# Patient Record
Sex: Female | Born: 1965
Health system: Southern US, Community
[De-identification: ages and names within clinical notes are randomized; demographics above are authoritative.]

## PROBLEM LIST (undated history)

## (undated) DIAGNOSIS — H9209 Otalgia, unspecified ear: Secondary | ICD-10-CM

## (undated) DIAGNOSIS — I7300 Raynaud's syndrome without gangrene: Secondary | ICD-10-CM

## (undated) DIAGNOSIS — D649 Anemia, unspecified: Secondary | ICD-10-CM

## (undated) DIAGNOSIS — J111 Influenza due to unidentified influenza virus with other respiratory manifestations: Secondary | ICD-10-CM

## (undated) DIAGNOSIS — R200 Anesthesia of skin: Principal | ICD-10-CM

## (undated) DIAGNOSIS — T7589XA Other specified effects of external causes, initial encounter: Secondary | ICD-10-CM

## (undated) DIAGNOSIS — A770 Spotted fever due to Rickettsia rickettsii: Secondary | ICD-10-CM

## (undated) HISTORY — DX: Spotted fever due to Rickettsia rickettsii: A77.0

## (undated) HISTORY — DX: Influenza due to unidentified influenza virus with other respiratory manifestations: J11.1

## (undated) HISTORY — DX: Anemia, unspecified: D64.9

## (undated) HISTORY — PX: TONSILLECTOMY AND ADENOIDECTOMY: SHX28

## (undated) HISTORY — DX: Otalgia, unspecified ear: H92.09

## (undated) HISTORY — PX: OTHER SURGICAL HISTORY: SHX169

## (undated) HISTORY — DX: Anesthesia of skin: R20.0

## (undated) HISTORY — DX: Raynaud's syndrome without gangrene: I73.00

---

## 2004-04-12 ENCOUNTER — Other Ambulatory Visit: Admission: RE | Admit: 2004-04-12 | Discharge: 2004-04-12 | Payer: Self-pay | Admitting: Family Medicine

## 2005-06-17 ENCOUNTER — Other Ambulatory Visit: Admission: RE | Admit: 2005-06-17 | Discharge: 2005-06-17 | Payer: Self-pay | Admitting: Family Medicine

## 2005-09-26 ENCOUNTER — Other Ambulatory Visit: Admission: RE | Admit: 2005-09-26 | Discharge: 2005-09-26 | Payer: Self-pay | Admitting: Family Medicine

## 2006-12-08 ENCOUNTER — Other Ambulatory Visit: Admission: RE | Admit: 2006-12-08 | Discharge: 2006-12-08 | Payer: Self-pay | Admitting: Family Medicine

## 2006-12-16 ENCOUNTER — Encounter: Admission: RE | Admit: 2006-12-16 | Discharge: 2006-12-16 | Payer: Self-pay | Admitting: Family Medicine

## 2008-01-26 ENCOUNTER — Ambulatory Visit (HOSPITAL_BASED_OUTPATIENT_CLINIC_OR_DEPARTMENT_OTHER): Admission: RE | Admit: 2008-01-26 | Discharge: 2008-01-26 | Payer: Self-pay | Admitting: Urology

## 2008-02-29 ENCOUNTER — Other Ambulatory Visit: Admission: RE | Admit: 2008-02-29 | Discharge: 2008-02-29 | Payer: Self-pay | Admitting: Obstetrics & Gynecology

## 2008-10-27 ENCOUNTER — Encounter: Admission: RE | Admit: 2008-10-27 | Discharge: 2008-10-27 | Payer: Self-pay | Admitting: Family Medicine

## 2010-10-23 NOTE — Op Note (Signed)
NAME:  Ashley Mcclain, Ashley Mcclain                  ACCOUNT NO.:  [id number]   MEDICAL RECORD NO.:  [id number]         PATIENT TYPE:  HAMB   LOCATION:                               FACILITY:  [facility]   PHYSICIAN:  Martina Sinner, MD DATE OF BIRTH:  [birth year]   DATE OF PROCEDURE:  01/26/2008  DATE OF DISCHARGE:                               OPERATIVE REPORT   PREOPERATIVE DIAGNOSIS:  Pelvic pain and frequency.   POSTOPERATIVE DIAGNOSIS:  Pelvic pain and frequency and interstitial  cystitis.   OPERATION PERFORMED:  Cystoscopy with bladder hydrodistention and  bladder instillation therapy.   SURGEON:  Martina Sinner, MD   ANESTHESIA:   INDICATIONS FOR PROCEDURE:  [name] has frequency and  pelvic pressure.   DESCRIPTION OF PROCEDURE:  She was prepped and draped in the usual  fashion.  She was given preoperative antibiotics.   She was hydrodistended to approximately 650 mL.  The bag was at  appropriate height.  Initially the bladder mucosa and trigone were  normal.  There was no stitch, foreign body or carcinoma.   After I emptied the bladder, I relooked with the cystoscope and she had  diffuse glomerulations.  She had no ulcers.  She had no injury to the  bladder.  I did my usual light irrigation technique.   With the bladder nearly emptied, I instilled 15 mL of 0.5% Marcaine plus  400 mg of Pyridium.   In my opinion [name] had interstitial cystitis and it will be treated  as such.  I sent her home with Vicodin and ciprofloxacin.  I will see  her in approximately one week.           ______________________________  Martina Sinner, MD  Electronically Signed     SAM/MEDQ  D:  01/26/2008  T:  01/26/2008  Job:  [id number]

## 2010-10-27 LAB — LDL CHOLESTEROL, DIRECT: LDL Calculated: 89

## 2011-11-09 LAB — LDL CHOLESTEROL, DIRECT: LDL Calculated: 97

## 2012-06-10 HISTORY — PX: COLONOSCOPY W/ POLYPECTOMY: SHX1380

## 2012-07-23 LAB — HM MAMMOGRAPHY

## 2012-07-30 LAB — HM MAMMOGRAPHY

## 2012-08-04 ENCOUNTER — Ambulatory Visit (INDEPENDENT_AMBULATORY_CARE_PROVIDER_SITE_OTHER): Payer: 59 | Admitting: Emergency Medicine

## 2012-08-04 VITALS — BP 129/71 | HR 82 | Temp 98.8°F | Resp 16 | Ht 63.5 in | Wt 134.8 lb

## 2012-08-04 DIAGNOSIS — I7300 Raynaud's syndrome without gangrene: Secondary | ICD-10-CM

## 2012-08-04 DIAGNOSIS — Z0000 Encounter for general adult medical examination without abnormal findings: Secondary | ICD-10-CM

## 2012-08-04 DIAGNOSIS — M76899 Other specified enthesopathies of unspecified lower limb, excluding foot: Secondary | ICD-10-CM

## 2012-08-04 LAB — COMPREHENSIVE METABOLIC PANEL
Albumin: 4.5 g/dL (ref 3.5–5.2)
BUN: 17 mg/dL (ref 6–23)
CO2: 26 mEq/L (ref 19–32)
Calcium: 9.3 mg/dL (ref 8.4–10.5)
Glucose, Bld: 87 mg/dL (ref 70–99)
Potassium: 3.8 mEq/L (ref 3.5–5.3)
Sodium: 138 mEq/L (ref 135–145)
Total Protein: 7.4 g/dL (ref 6.0–8.3)

## 2012-08-04 LAB — POCT URINALYSIS DIPSTICK
Blood, UA: NEGATIVE
Glucose, UA: NEGATIVE
Leukocytes, UA: NEGATIVE
Nitrite, UA: NEGATIVE
Urobilinogen, UA: 0.2

## 2012-08-04 LAB — POCT CBC
Granulocyte percent: 68.5 %G (ref 37–80)
HCT, POC: 40.7 % (ref 37.7–47.9)
Hemoglobin: 13 g/dL (ref 12.2–16.2)
Lymph, poc: 2 (ref 0.6–3.4)
MCV: 91.4 fL (ref 80–97)
POC Granulocyte: 5.5 (ref 2–6.9)
POC LYMPH PERCENT: 24.8 %L (ref 10–50)

## 2012-08-04 LAB — POCT UA - MICROSCOPIC ONLY
Crystals, Ur, HPF, POC: NEGATIVE
RBC, urine, microscopic: NEGATIVE
WBC, Ur, HPF, POC: NEGATIVE

## 2012-08-04 LAB — MAGNESIUM: Magnesium: 1.8 mg/dL (ref 1.5–2.5)

## 2012-08-04 LAB — LIPID PANEL
Cholesterol: 159 mg/dL (ref 0–200)
HDL: 61 mg/dL (ref >39)
Triglycerides: 80 mg/dL (ref <150)

## 2012-08-04 LAB — PHOSPHORUS: Phosphorus: 4.1 mg/dL (ref 2.3–4.6)

## 2012-08-04 MED ORDER — MELOXICAM 15 MG PO TABS: 15.0000 mg | ORAL_TABLET | Freq: Every day | ORAL | Status: DC

## 2012-08-04 NOTE — Patient Instructions (Addendum)
Raynaud's Syndrome  Raynaud's Syndrome is a disorder of the blood vessels in your hands and feet. It occurs when small arteries of the arms/hands or legs/feet become sensitive to cold or emotional upset. This causes the arteries to constrict, or narrow, and reduces blood flow to the area. The color in the fingers or toes changes from white to bluish to red and this is not usually painful. There may be numbness and tingling. Sores on the skin (ulcers) can form. Symptoms are usually relieved by warming.  HOME CARE INSTRUCTIONS     Avoid exposure to cold. Keep your whole body warm and dry. Dress in layers. Wear mittens or gloves when handling ice or frozen food and when outdoors. Use holders for glasses or cans containing cold drinks. If possible, stay indoors during cold weather.   Limit your use of caffeine. Switch to decaffeinated coffee, tea, and soda pop. Avoid chocolate.   Avoid smoking or being around cigarette smoke. Smoke will make symptoms worse.   Wear loose fitting socks and comfortable, roomy shoes.   Avoid vibrating tools and machinery.   If possible, avoid stressful and emotional situations. Exercise, meditation and yoga may help you cope with stress. Biofeedback may be useful.   Ask your caregiver about medicine (calcium channel blockers) that may control Raynaud's phenomena.  SEEK MEDICAL CARE IF:     Your discomfort becomes worse, despite conservative treatment.   You develop sores on your fingers and toes that do not heal.  Document Released: 05/24/2000 Document Revised: 08/19/2011 Document Reviewed: 05/31/2008  [employer] Patient Information 2013 [employer].

## 2012-08-04 NOTE — Progress Notes (Signed)
[facility] [address], [place] [address] [phone]  Date:  08/04/2012   Name:  Ashley Mcclain   DOB:  [birth year]   MRN:  [id number]  PCP:  No primary provider on file.    Chief Complaint: Annual Exam   History of Present Illness:  Ashley Mcclain is a 47 y.o. very pleasant female patient who presents with the following:  Has had no health insurance and no health care or screening in 5 years.  Has Raynaud's symptoms when exposed to cold.   Has noticed that she has cramps in her legs when she exercises and her toes and feet cramp as well.  She has been taking a magnesium supplement but she still has symptoms.  Has some pain in her right hip that interferes with external rotation and abduction.  No other complaints or health concerns  There is no problem list on file for this patient.   Past Medical History  Diagnosis Date  . Allergy     History reviewed. No pertinent past surgical history.  History  Substance Use Topics  . Smoking status: Never Smoker   . Smokeless tobacco: Not on file  . Alcohol Use: No    Family History  Problem Relation Age of Onset  . Hypertension Mother   . Fibromyalgia Mother   . Arthritis Father   . Hypertension Brother     No Known Allergies  Medication list has been reviewed and updated.  No current outpatient prescriptions on file prior to visit.   No current facility-administered medications on file prior to visit.    Review of Systems:  As per HPI, otherwise negative.    Physical Examination: Filed Vitals:   08/04/12 1410  BP: 129/71  Pulse: 82  Temp: 98.8 F (37.1 C)  Resp: 16   Filed Vitals:   08/04/12 1410  Height: 5' 3.5" (1.613 m)  Weight: 134 lb 12.8 oz (61.145 kg)   Body mass index is 23.5 kg/(m^2). Ideal Body Weight: Weight in (lb) to have BMI = 25: 143.1  GEN: WDWN, NAD, Non-toxic, A & O x 3 HEENT: Atraumatic, Normocephalic. Neck supple. No masses, No LAD. Ears and Nose: No  external deformity. CV: RRR, No M/G/R. No JVD. No thrill. No extra heart sounds. PULM: CTA B, no wheezes, crackles, rhonchi. No retractions. No resp. distress. No accessory muscle use. ABD: S, NT, ND, +BS. No rebound. No HSM. EXTR: No c/c/e.  Hip (right) full passive ROM with some pain NEURO Normal gait.  PSYCH: Normally interactive. Conversant. Not depressed or anxious appearing.  Calm demeanor.  Pelvic - normal external genitalia, vulva, vagina, cervix, uterus and adnexa   Assessment and Plan: Raynaud's phenomenon Follow up based on labs  Carmelina Dane, MD

## 2012-08-05 NOTE — Progress Notes (Signed)
Reviewed and agree.

## 2012-08-06 LAB — PAP IG, CT-NG, RFX HPV ASCU

## 2013-03-22 ENCOUNTER — Telehealth: Payer: Self-pay

## 2013-03-22 NOTE — Telephone Encounter (Signed)
Records ready for pickup. Patient notified. °

## 2013-03-22 NOTE — Telephone Encounter (Signed)
PT STATES SHE WOULD LIKE TO COME BY AND PICK UP A COPY OF HER LAB RESULTS. SHE HAVE AN APPT TOMORROW WITH ANOTHER DR Wynelle Link CALL [phone]

## 2013-03-26 ENCOUNTER — Encounter (INDEPENDENT_AMBULATORY_CARE_PROVIDER_SITE_OTHER): Payer: Self-pay

## 2013-03-26 ENCOUNTER — Encounter: Payer: Self-pay | Admitting: Neurology

## 2013-03-26 ENCOUNTER — Ambulatory Visit (INDEPENDENT_AMBULATORY_CARE_PROVIDER_SITE_OTHER): Payer: 59 | Admitting: Neurology

## 2013-03-26 VITALS — BP 100/61 | HR 61 | Temp 96.7°F | Ht 64.0 in | Wt 136.0 lb

## 2013-03-26 DIAGNOSIS — R252 Cramp and spasm: Secondary | ICD-10-CM

## 2013-03-26 DIAGNOSIS — R202 Paresthesia of skin: Secondary | ICD-10-CM

## 2013-03-26 DIAGNOSIS — R209 Unspecified disturbances of skin sensation: Secondary | ICD-10-CM

## 2013-03-26 DIAGNOSIS — R200 Anesthesia of skin: Secondary | ICD-10-CM

## 2013-03-26 HISTORY — DX: Anesthesia of skin: R20.0

## 2013-03-26 NOTE — Progress Notes (Signed)
Subjective:    Patient ID: Ashley Mcclain is a 47 y.o. female.  HPI  [name], MD, [name] [facility] [address] [address] [place], [place] [address]  Dear Dr. Penni Bombard,  I saw your patient, Ashley Mcclain, upon your kind request in my neurologic clinic today for initial consultation of her L leg numbness, cramping and paresthesias. The patient is unaccompanied today. As you know, Ashley Mcclain is a very pleasant 47 year old right-handed woman with an underlying medical history of anemia who has been seen you for right hip pain, in the context of osteoarthritis. She had MRI testing as well as EMG and nerve conduction testing. Her EMG and nerve conduction test was reported as normal on 02/22/13, performed by Dr. Ethelene Hal and her right hip MRI from 02/10/2013 showed advanced right hip osteoarthrosis. Symptoms started about a year ago, and initially happened with activity and consisted of cramping and tingling in the calf and pressure and some tingling. Symptoms do not ascend to the upper leg, and the Sx last about 15 minutes, and even at baseline, her L distal leg does not "feel right". She has no upper body Sx and no similar Sx on the R distal leg, other than tightnee in the calf. She has R hip pain and will likely need a R THR. She has no neck pain, no radiating LBP, no injuries to head or spine. She has no FHx neurological illness, other than stroke in [facility]. She has noted no twitching, no atrophy, no calf swelling or tenderness upon touch. She has had no bug bites, no exposures to toxins or chemicals, no rash.  She had blood work on 08/04/12, which I reviewed: N CBC, N CMP, N TSH and Vit D low normal.    Her Past Medical History Is Significant For: Past Medical History  Diagnosis Date  . Allergy     To grass  . Anemia     Mild and was told she had low B12 in the past    Her Past Surgical History Is Significant For: Past Surgical History  Procedure Laterality Date  .  Tonsilectomy, adenoidectomy, bilateral myringotomy and tubes      Her Family History Is Significant For: Family History  Problem Relation Age of Onset  . Hypertension Mother   . Fibromyalgia Mother   . Arthritis Father   . Hypertension Brother     Her Social History Is Significant For: History   Social History  . Marital Status: Married    Spouse Name: N/A    Number of Children: N/A  . Years of Education: N/A   Social History Main Topics  . Smoking status: Never Smoker   . Smokeless tobacco: None  . Alcohol Use: No  . Drug Use: No  . Sexual Activity: No   Other Topics Concern  . None   Social History Narrative  . None    Her Allergies Are:  No Known Allergies:   Her Current Medications Are:  Outpatient Encounter Prescriptions as of 03/26/2013  Medication Sig Dispense Refill  . Probiotic Product (ULTRAFLORA IMMUNE HEALTH PO) Take 2 tablets by mouth daily.      Marland Kitchen XIFAXAN 550 MG TABS tablet Take 2 tablets by mouth daily.      . magnesium 30 MG tablet Take 30 mg by mouth 2 (two) times daily.      . meloxicam (MOBIC) 15 MG tablet Take 1 tablet (15 mg total) by mouth daily.  30 tablet  5  . Multiple Vitamins-Minerals (MULTIVITAMIN WITH MINERALS) tablet Take 1 tablet by mouth daily.      . vitamin B-12 (CYANOCOBALAMIN) 100 MCG tablet Take 50 mcg by mouth daily.       No facility-administered encounter medications on file as of 03/26/2013.   Review of Systems:  Out of a complete 14 point review of systems, all are reviewed and negative with the exception of these symptoms as listed below:  Review of Systems  HENT: Positive for rhinorrhea.   Musculoskeletal: Positive for arthralgias (hip).  Allergic/Immunologic: Positive for environmental allergies.  Neurological:       Restless leg    Objective:  Neurologic Exam  Physical Exam Physical Examination:   Filed Vitals:   03/26/13 0829  BP: 100/61  Pulse: 61  Temp: 96.7 F (35.9 C)    General Examination:  The patient is a very pleasant 47 y.o. female in no acute distress. She appears well-developed and well-nourished and very well groomed.   HEENT: Normocephalic, atraumatic, pupils are equal, round and reactive to light and accommodation. Funduscopic exam is normal with sharp disc margins noted. Extraocular tracking is good without limitation to gaze excursion or nystagmus noted. Normal smooth pursuit is noted. Hearing is grossly intact. Tympanic membranes are clear bilaterally. Face is symmetric with normal facial animation and normal facial sensation. Speech is clear with no dysarthria noted. There is no hypophonia. There is no lip, neck/head, jaw or voice tremor. Neck is supple with full range of passive and active motion. There are no carotid bruits on auscultation. Oropharynx exam reveals: mild mouth dryness, good dental hygiene and no significant airway crowding. Mallampati is class I. Tongue protrudes centrally and palate elevates symmetrically.   Chest: Clear to auscultation without wheezing, rhonchi or crackles noted.  Heart: S1+S2+0, regular and normal without murmurs, rubs or gallops noted.   Abdomen: Soft, non-tender and non-distended with normal bowel sounds appreciated on auscultation.  Extremities: There is no pitting edema in the distal lower extremities bilaterally. Pedal pulses are intact. She has no calf tenderness upon palpation. She has no fasciculations in her legs or arms. She has no focal or generalized atrophy. She has no redness or palpable cord in her calves.  Skin: Warm and dry without trophic changes noted. There are no varicose veins.  Musculoskeletal: exam reveals no obvious joint deformities, tenderness or joint swelling or erythema.   Neurologically:  Mental status: The patient is awake, alert and oriented in all 4 spheres. Her memory, attention, language and knowledge are appropriate. There is no aphasia, agnosia, apraxia or anomia. Speech is clear with normal  prosody and enunciation. Thought process is linear. Mood is congruent and affect is normal.  Cranial nerves are as described above under HEENT exam. In addition, shoulder shrug is normal with equal shoulder height noted. Motor exam: Normal bulk, strength and tone is noted. There is no drift, tremor or rebound. She has mild right hip pain with right hip flexion. Romberg is negative. Reflexes are 2+ throughout. Toes are downgoing bilaterally. There is no clonus.  Fine motor skills are intact with normal finger taps, normal hand movements, normal rapid alternating patting, normal foot taps and normal foot agility.  Cerebellar testing shows no dysmetria or intention tremor on finger to nose testing. Heel to shin is unremarkable bilaterally. There is no truncal or gait ataxia.  Sensory exam is intact to light touch, pinprick, vibration, temperature sense and proprioception in the upper and lower extremities. Perhaps there is mild decrease  in pinprick sensation in her left big toe and the dorsal aspect of the left foot, but it is not fully reproducible.   Gait, station and balance are unremarkable. No veering to one side is noted. No leaning to one side is noted. Posture is age-appropriate and stance is narrow based. No problems turning are noted. She turns en bloc. Tandem walk is unremarkable. Intact toe and heel stance is noted.               Assessment and Plan:    In summary, KARALYNN COTTONE is a very pleasant 47 y.o.-year old female with a one-year history of left leg problems including cramping in her calf and foot, mild paresthesias, tightness sensation and numbness. Her exam is reassuringly nonfocal. I reassured the patient in that regard. She has right hip arthritis and may need a right total hip replacement she states. She does have mild right hip pain with activation of her right hip flexors. I had a long chat with the patient about my findings and her symptoms. Thus far she has had some blood work  several months ago and a recent EMG and nerve conduction test in your office. I would like to do further testing including additional blood work and a lumbar spine MRI. I am not sure how to tie in her symptoms altogether. Nevertheless we will do more testing and will call her back with her test results. I explained to her that there is no medication that would help her symptoms at this juncture as her symptoms are rather vague and probably mostly quite mild. She was in agreement. I will see her back in about 3 months, sooner if the need arises. She is encouraged to call with any interim questions, concerns, problems or updates and test results.   Thank you very much for allowing me to participate in the care of this nice patient. If I can be of any further assistance to you please do not hesitate to call me at [phone].  Sincerely,   [name], MD, [name]

## 2013-03-26 NOTE — Patient Instructions (Addendum)
I think overall you are doing fairly well but I do want to suggest a few things today:  Remember to drink plenty of fluid, eat healthy meals and do not skip any meals. Try to eat protein with a every meal and eat a healthy snack such as fruit or nuts in between meals. Try to keep a regular sleep-wake schedule and try to exercise daily, particularly in the form of walking, 20-30 minutes a day, if you can.   As far as your medications are concerned, I would like to suggest no new medications.    As far as diagnostic testing: Blood work today, schedule you for an MRI L spine.  I would like to see you back in 3 months, sooner if we need to. Please call us with any interim questions, concerns, problems, updates or refill requests.  Please also call us for any test results so we can go over those with you on the phone. Brett Canales is my clinical assistant and will answer any of your questions and relay your messages to me and also relay most of my messages to you.  Our phone number is [phone]. We also have an after hours call service for urgent matters and there is a physician on-call for urgent questions. For any emergencies you know to call 911 or go to the nearest emergency room.

## 2013-03-30 LAB — B12 AND FOLATE PANEL: Folate: 15.2 ng/mL (ref >3.0)

## 2013-03-30 LAB — VITAMIN D 25 HYDROXY (VIT D DEFICIENCY, FRACTURES): Vit D, 25-Hydroxy: 41.5 ng/mL (ref 30.0–100.0)

## 2013-03-30 LAB — C-REACTIVE PROTEIN: CRP: 0.6 mg/L (ref 0.0–4.9)

## 2013-03-30 LAB — SEDIMENTATION RATE: Sed Rate: 4 mm/hr (ref 0–32)

## 2013-03-30 NOTE — Progress Notes (Signed)
Quick Note:  Please call and advise the patient that the recent labs we checked were within normal limits. We checked vitamin D level, vitamin B12 level, blood sugar, diabetes marker, electrolytes, kidney function, liver function, inflammatory markers, sedimentation rate, muscle enzymes. The diabetes marker was borderline, which indicates risk for diabetes.  No additional action is required on these tests at this time. Please remind patient to keep any upcoming appointments or tests and to call us with any interim questions, concerns, problems or updates. Thanks,  [name], MD, [name]    ______

## 2013-03-31 ENCOUNTER — Telehealth: Payer: Self-pay

## 2013-03-31 NOTE — Telephone Encounter (Signed)
I called and left patient a VM that her labs were normal. Her diabetes marker was borderline but still okay. Please monitor your diet, weight and be sure to exercise and follow-up with your scheduled appointments. Appointments are MRI on 04/07/2013 at 2:00 p.m. And follow-up with Dr. Frances Furbish on July 28, 2013 at noon. Please remember to arrive 15 minutes early. Please call back if you need any more information.

## 2013-03-31 NOTE — Telephone Encounter (Signed)
Message copied by [facility] on Wed Mar 31, 2013 11:42 AM ------      Message from: [name]      Created: Tue Mar 30, 2013  5:24 PM       Please call and advise the patient that the recent labs we checked were within normal limits. We checked vitamin D level, vitamin B12 level, blood sugar, diabetes marker, electrolytes, kidney function, liver function, inflammatory markers, sedimentation rate, muscle enzymes. The diabetes marker was borderline, which indicates risk for diabetes.       No additional action is required on these tests at this time. Please remind patient to keep any upcoming appointments or tests and to call us with any interim questions, concerns, problems or updates. Thanks,       [name], MD, [name]                   ------

## 2013-04-07 ENCOUNTER — Ambulatory Visit (INDEPENDENT_AMBULATORY_CARE_PROVIDER_SITE_OTHER): Payer: 59

## 2013-04-07 DIAGNOSIS — R209 Unspecified disturbances of skin sensation: Secondary | ICD-10-CM

## 2013-04-07 DIAGNOSIS — R252 Cramp and spasm: Secondary | ICD-10-CM

## 2013-04-07 DIAGNOSIS — R202 Paresthesia of skin: Secondary | ICD-10-CM

## 2013-04-07 DIAGNOSIS — R200 Anesthesia of skin: Secondary | ICD-10-CM

## 2013-04-07 MED ORDER — GADOPENTETATE DIMEGLUMINE 469.01 MG/ML IV SOLN: 12.0000 mL | Freq: Once | INTRAVENOUS | Status: AC | PRN

## 2013-04-12 NOTE — Progress Notes (Signed)
Quick Note:  Please call and advise the patient that the recent scan we did was within normal limits. We did a lumbar spine MRI without contrast which showed age-appropriate findings. No significant spinal stenosis was seen or any severe degenerative changes. She did have mild disc bulging at L4-5, which should be of no significance or consequence. No further action is required on this test at this time. Please remind patient to keep any upcoming appointments or tests and to call us with any interim questions, concerns, problems or updates. Thanks,  [name], MD, [name]   ______

## 2013-04-13 NOTE — Progress Notes (Signed)
Quick Note:  Shared normal MR lumbar spine results per Dr Teofilo Pod findings thru VM message ______

## 2013-06-18 ENCOUNTER — Ambulatory Visit (INDEPENDENT_AMBULATORY_CARE_PROVIDER_SITE_OTHER): Payer: 59 | Admitting: Emergency Medicine

## 2013-06-18 ENCOUNTER — Ambulatory Visit: Payer: 59

## 2013-06-18 VITALS — BP 118/60 | HR 85 | Temp 98.9°F | Resp 16 | Ht 64.0 in | Wt 135.8 lb

## 2013-06-18 DIAGNOSIS — R05 Cough: Secondary | ICD-10-CM

## 2013-06-18 DIAGNOSIS — R059 Cough, unspecified: Secondary | ICD-10-CM

## 2013-06-18 DIAGNOSIS — J209 Acute bronchitis, unspecified: Secondary | ICD-10-CM

## 2013-06-18 LAB — POCT INFLUENZA A/B
INFLUENZA A, POC: NEGATIVE
Influenza B, POC: NEGATIVE

## 2013-06-18 MED ORDER — AZITHROMYCIN 250 MG PO TABS: ORAL_TABLET | ORAL | Status: DC

## 2013-06-18 MED ORDER — BENZONATATE 100 MG PO CAPS: 100.0000 mg | ORAL_CAPSULE | Freq: Three times a day (TID) | ORAL | Status: DC | PRN

## 2013-06-18 NOTE — Progress Notes (Signed)
   Subjective:    Patient ID: Ashley Mcclain, female    DOB: 1965/11/15, 48 y.o.   MRN: [id number]  HPI Starting new years eve, pt started with cough. Has taken mucinex, mucinex dm, theraflu, nothing is relieving. Headache, nasal congestion, productive cough only today. Neg for wheezing, neg for smoking. Did not have flu shot this year. Has felt like she has had a fever. +chills. She also complains of being dizzy.    Review of Systems     Objective:   Physical Exam patient is alert and cooperative she is nontoxic appearing. Her neck is supple. TMs clear nose normal throat clear her chest had occasional rhonchi but no rales no dullness breath sounds are symmetrical to Results for orders placed in visit on 06/18/13  POCT INFLUENZA A/B      Result Value Range   Influenza A, POC Negative     Influenza B, POC Negative     [facility] reading (PRIMARY) by  Dr. Cleta Albertsaub mild increased markings right lower lobe        Assessment & Plan:  We'll treat with Tessalon Perles and a Z-Pak .

## 2013-07-28 ENCOUNTER — Ambulatory Visit: Payer: 59 | Admitting: Neurology

## 2013-12-13 ENCOUNTER — Ambulatory Visit: Payer: 59 | Admitting: Neurology

## 2014-01-18 ENCOUNTER — Encounter: Payer: Self-pay | Admitting: Gynecology

## 2014-01-18 ENCOUNTER — Ambulatory Visit (INDEPENDENT_AMBULATORY_CARE_PROVIDER_SITE_OTHER): Payer: 59 | Admitting: Gynecology

## 2014-01-18 VITALS — BP 114/62 | HR 68 | Resp 12 | Ht 64.0 in | Wt 134.0 lb

## 2014-01-18 DIAGNOSIS — R159 Full incontinence of feces: Secondary | ICD-10-CM

## 2014-01-18 DIAGNOSIS — R6889 Other general symptoms and signs: Secondary | ICD-10-CM

## 2014-01-18 DIAGNOSIS — G629 Polyneuropathy, unspecified: Secondary | ICD-10-CM

## 2014-01-18 DIAGNOSIS — Z0000 Encounter for general adult medical examination without abnormal findings: Secondary | ICD-10-CM

## 2014-01-18 DIAGNOSIS — IMO0002 Reserved for concepts with insufficient information to code with codable children: Secondary | ICD-10-CM

## 2014-01-18 DIAGNOSIS — G609 Hereditary and idiopathic neuropathy, unspecified: Secondary | ICD-10-CM

## 2014-01-18 DIAGNOSIS — G608 Other hereditary and idiopathic neuropathies: Secondary | ICD-10-CM

## 2014-01-18 DIAGNOSIS — Z01419 Encounter for gynecological examination (general) (routine) without abnormal findings: Secondary | ICD-10-CM

## 2014-01-18 DIAGNOSIS — N920 Excessive and frequent menstruation with regular cycle: Secondary | ICD-10-CM | POA: Insufficient documentation

## 2014-01-18 LAB — POCT URINALYSIS DIPSTICK
PH UA: 5
UROBILINOGEN UA: NEGATIVE

## 2014-01-18 LAB — HEMOGLOBIN, FINGERSTICK: HEMOGLOBIN, FINGERSTICK: 12.7 g/dL (ref 12.0–16.0)

## 2014-01-18 NOTE — Progress Notes (Signed)
48 y.o. Divorced  Caucasian female   G2P2002 here for annual exam. Pt is not currently sexually active.  Pt reports change in cycles, can change super tampon every hr with clots for 2d, increase in dysmenorrhea and rectal pain, difficulty sitting.  After heavy flow and spot on and off for several days.  Change noted over the past year.  No change in weight.  Reports loose stools and was seen by GI-had polypectomy.  Denies fibroids.  Not sexually active 4y.  Neg GC/CTM negative, TSH upper normal 3.5.  Pt has had some loss of loose stools seems diet related.  Can notice stool after voids.  Pt had 2 svd- 8#11, 8#6.  Low forcep for first.  Patient's last menstrual period was 01/11/2014.          Sexually active: No.  The current method of family planning is none.    Exercising: Yes.    cardio, weights 4-5x/wk Last pap: 08/04/12 ASCUS, neg HPV Alcohol: No Tobacco: No BSE: occassionally  Mammogram: 10/28/08 Bi-Rads 1  Hgb: 12.7 ; Urine: Trace Leuks   Health Maintenance  Topic Date Due  . Tetanus/tdap  04/20/1986  . Influenza Vaccine  01/08/2014  . Pap Smear  08/05/2015    Family History  Problem Relation Age of Onset  . Fibromyalgia Mother   . Arthritis Father   . Hypertension Brother   . Diabetes Maternal Grandfather   . Stroke Maternal Uncle   . Hypertension Father     Patient Active Problem List   Diagnosis Date Noted  . Numbness in left leg 03/26/2013    Past Medical History  Diagnosis Date  . Allergy     To grass  . Anemia     Mild and was told she had low B12 in the past  . Numbness in left leg 03/26/2013    Past Surgical History  Procedure Laterality Date  . Tonsilectomy, adenoidectomy, bilateral myringotomy and tubes    . Rectal polypectomy  2014    x3    Allergies: Review of patient's allergies indicates no known allergies.  Current Outpatient Prescriptions  Medication Sig Dispense Refill  . meloxicam (MOBIC) 15 MG tablet Take 1 tablet (15 mg total) by mouth  daily.  30 tablet  5  . Multiple Vitamins-Minerals (MULTIVITAMIN WITH MINERALS) tablet Take 1 tablet by mouth daily.      . Probiotic Product (ULTRAFLORA IMMUNE HEALTH PO) Take 2 tablets by mouth daily.      . vitamin B-12 (CYANOCOBALAMIN) 100 MCG tablet Take 50 mcg by mouth daily.      Marland Kitchen. XIFAXAN 550 MG TABS tablet Take 2 tablets by mouth daily.       No current facility-administered medications for this visit.    ROS: Pertinent items are noted in HPI.  Exam:    BP 114/62  Pulse 68  Resp 12  Ht 5\' 4"  (1.626 m)  Wt 134 lb (60.782 kg)  BMI 22.99 kg/m2  LMP 01/11/2014 Weight change: @WEIGHTCHANGE @ Last 3 height recordings:  Ht Readings from Last 3 Encounters:  01/18/14 5\' 4"  (1.626 m)  06/18/13 5\' 4"  (1.626 m)  03/26/13 5\' 4"  (1.626 m)   General appearance: alert, cooperative and appears stated age Head: Normocephalic, without obvious abnormality, atraumatic Neck: no adenopathy, no carotid bruit, no JVD, supple, symmetrical, trachea midline and thyroid not enlarged, symmetric, no tenderness/mass/nodules Lungs: clear to auscultation bilaterally Breasts: normal appearance, no masses or tenderness Heart: regular rate and rhythm, S1, S2 normal, no murmur,  click, rub or gallop Abdomen: soft, non-tender; bowel sounds normal; no masses,  no organomegaly Extremities: extremities normal, atraumatic, no cyanosis or edema Skin: Skin color, texture, turgor normal. No rashes or lesions Lymph nodes: Cervical, supraclavicular, and axillary nodes normal. no inguinal nodes palpated Neurologic: Grossly normal   Pelvic: External genitalia:  no lesions              Urethra: normal appearing urethra with no masses, tenderness or lesions              Bartholins and Skenes: Bartholin's, Urethra, Skene's normal                 Vagina: normal appearing vagina with normal color and discharge, no lesions              Cervix: normal appearance              Pap taken: No.        Bimanual Exam:  Uterus:   mobile, globular                                      Adnexa:    normal adnexa in size, nontender and no masses                                      Rectovaginal:  short perineum, confirms                                      Anus:  Weak rectal tone   1. Routine gynecological examination  counseled on breast self exam, mammography screening-overdue adequate intake of calcium and vitamin D, diet and exercise return annually or prn    2. Laboratory examination ordered as part of a routine general medical examination  - POCT Urinalysis Dipstick - Hemoglobin, fingerstick  3. Menorrhagia with regular cycle Discussed uterine changes associated with aging, recommend SHG, procedure outlined to pt.  She has had negative cultures and normal TSH  In recent past. Discussed briefly IUD or ocp to regulate menses if SHG is normal - [place] Sonohysterogram; Future  4. Peripheral sensory neuropathy Rule out uterine pathology.  Pt also with weak rectal tone, same peripheral nerve as sole of foot - [place] Sonohysterogram; Future  5. ASCUS neg HPV reviewed new pap guidelines, pap not repeat today An After Visit Summary was printed and given to the patient.

## 2014-01-24 ENCOUNTER — Telehealth: Payer: Self-pay | Admitting: Gynecology

## 2014-01-24 NOTE — Telephone Encounter (Signed)
Spoke with patient. Advised that per benefit quote received, she will be responsible for $30 copay [facility]. Patient is to call with cycle to schedule.

## 2014-02-10 NOTE — Telephone Encounter (Signed)
Spoke with patient. SHGM scheduled for 9/8 at 2pm with 3pm consult with Dr.Lathrop. Patient agreeable to date and time. Overview of SHGM given. Patient aware to call asap if she needs to cancel as it is already technically less than 72 hours with the holiday on Monday. Patient is agreeable. Advised to take  of ibuprofen/motrin one hour before appointment to reduce any cramping. Patient agreeable.  Routing to provider for final review. Patient agreeable to disposition. Will close encounter

## 2014-02-10 NOTE — Telephone Encounter (Signed)
Pt calling to schedule ultrasound. °

## 2014-02-15 ENCOUNTER — Other Ambulatory Visit: Payer: Self-pay | Admitting: Gynecology

## 2014-02-15 ENCOUNTER — Ambulatory Visit (INDEPENDENT_AMBULATORY_CARE_PROVIDER_SITE_OTHER): Payer: 59 | Admitting: Gynecology

## 2014-02-15 ENCOUNTER — Encounter: Payer: Self-pay | Admitting: Gynecology

## 2014-02-15 ENCOUNTER — Ambulatory Visit (INDEPENDENT_AMBULATORY_CARE_PROVIDER_SITE_OTHER): Payer: 59

## 2014-02-15 VITALS — BP 108/60 | Resp 12 | Ht 64.0 in | Wt 138.0 lb

## 2014-02-15 DIAGNOSIS — Z3009 Encounter for other general counseling and advice on contraception: Secondary | ICD-10-CM

## 2014-02-15 DIAGNOSIS — G629 Polyneuropathy, unspecified: Secondary | ICD-10-CM

## 2014-02-15 DIAGNOSIS — N920 Excessive and frequent menstruation with regular cycle: Secondary | ICD-10-CM

## 2014-02-15 DIAGNOSIS — G608 Other hereditary and idiopathic neuropathies: Secondary | ICD-10-CM

## 2014-02-15 DIAGNOSIS — G609 Hereditary and idiopathic neuropathy, unspecified: Secondary | ICD-10-CM

## 2014-02-15 NOTE — Patient Instructions (Signed)
Call with upcoming cycle for IUD placement Take 600-800mg  motrin before-30m

## 2014-02-15 NOTE — Progress Notes (Signed)
      Pt here for PUS for menorrhagia with regular cycle Images reviewed with pt-normal appearing uterus, no fibroids, uterus with small amount of fluid outlining endometrium, no defects noted. Ovaries are normal, no free fluid. Discussed options to treat-ocp, progestin IUD, nexplanon-modes of action, risks and benefits of all reviewed.  Pt prefers to try mirena IUD and hopefully can use until menopause. Informed regarding risks of infection and uterine perforation. Anticipated bleeding profile reviewed as well. Handout given. Questions addressed. 46m spent counseling, >50% face to face

## 2014-02-22 ENCOUNTER — Telehealth: Payer: Self-pay | Admitting: Gynecology

## 2014-02-22 NOTE — Telephone Encounter (Signed)
Left message for patient to call back. Need to go over iud benefits °

## 2014-02-23 NOTE — Telephone Encounter (Signed)
Spoke with patient. Advised that per benefits quote received, IUD and insertion is covered at 100% after $30 copay.  Patient is to call within the first 5 days of her cycle to schedule insertion.

## 2014-02-23 NOTE — Telephone Encounter (Signed)
Pt returning call

## 2014-03-14 ENCOUNTER — Telehealth: Payer: Self-pay | Admitting: Emergency Medicine

## 2014-03-14 NOTE — Telephone Encounter (Signed)
Incoming call from patient. Started cycle on 03/13/14 and would like to schedule IUD insertion with Dr. Farrel GobbleLathrop. Scheduled for 03/16/14 with Dr. Farrel GobbleLathrop  Pre procedure instructions given.  Motrin instructions given. Motrin=Advil=Ibuprofen, 800 mg one hour before appointment. Eat a meal and hydrate well before appointment.  Patient agreeable.   Routing to provider for final review. Patient agreeable to disposition. Will close encounter

## 2014-03-16 ENCOUNTER — Encounter: Payer: Self-pay | Admitting: Gynecology

## 2014-03-16 ENCOUNTER — Ambulatory Visit (INDEPENDENT_AMBULATORY_CARE_PROVIDER_SITE_OTHER): Payer: 59 | Admitting: Gynecology

## 2014-03-16 VITALS — BP 108/62 | HR 60 | Resp 16 | Ht 64.0 in | Wt 138.0 lb

## 2014-03-16 DIAGNOSIS — Z30430 Encounter for insertion of intrauterine contraceptive device: Secondary | ICD-10-CM

## 2014-03-16 NOTE — Progress Notes (Signed)
47 yrs DivorcedCaucasianfemale presents for  insertion of Mirena. Denies any vaginal symptoms or STD concerns.  LMP: 03/14/14  Patient read information regarding IUD insertion.  All questions addressed.    Healthy female,time, place and personnormal menses, no abnormal bleeding, pelvic pain or discharge, no breast pain or new or enlarging lumps on self exam Abdomen: soft, non-tender Groinno inguinal nodes palpated  Pelvic exam: Vulva;normal female genitalia  Vagina:normal vagina, no discharge, exudate, lesion, or erythema  Cervix:Non-tender, Negative CMT, no lesions or redness, nulliparous/parous os  Uterus:normal shape, position and consistency     Procedure:  Speculum inserted into vagina. Cervix visualized and cleansed with betadine solution X 3. Lidocaine jelly placed anteriorly and endocervix Tenaculum placed on cervix at 12 o'clock position(s).  Uterus sounded to 8 centimeters.  IUD removed from sterile packet and under sterile conditions inserted to fundus of uterus.  Introducer removed without difficulty.  IUD string trimmed to 3 centimeters.  Remainder string given to patient to feel for identification.  Tenaculum removed.  No bleeding noted.  Speculum removed.  Uterus palpated normal.  Patient tolerated procedure well.  A: Insertion of Mirena, Lot # [id number], Expiration date 8/17   P:  Instructions and warnings signs given.       IUD identification card given with IUD removal 03/2019       Return visit [id number]

## 2014-04-06 ENCOUNTER — Telehealth: Payer: Self-pay | Admitting: Gynecology

## 2014-04-06 NOTE — Telephone Encounter (Signed)
Patient only wants to see Dr. Hyacinth MeekerMiller for her IUD recheck rescheduled appointment that was with Dr. Farrel GobbleLathrop. Please advise?

## 2014-04-06 NOTE — Telephone Encounter (Signed)
Call to pt to let her know that her appt for 04/15/14 has been canceled and we need to rs it is for 1 mth reck for IUD

## 2014-04-07 NOTE — Telephone Encounter (Signed)
Returned call to patient. Scheduled follow up iud insertion check with Dr. Hyacinth MeekerMiller for 04/29/14 at 1600. Patient agreeable.  Routing to provider for final review. Patient agreeable to disposition. Will close encounter

## 2014-04-11 ENCOUNTER — Encounter: Payer: Self-pay | Admitting: Gynecology

## 2014-04-15 ENCOUNTER — Ambulatory Visit: Payer: 59 | Admitting: Gynecology

## 2014-04-29 ENCOUNTER — Ambulatory Visit (INDEPENDENT_AMBULATORY_CARE_PROVIDER_SITE_OTHER): Payer: 59 | Admitting: Obstetrics & Gynecology

## 2014-04-29 ENCOUNTER — Ambulatory Visit: Payer: 59 | Admitting: Obstetrics & Gynecology

## 2014-04-29 ENCOUNTER — Encounter: Payer: Self-pay | Admitting: Obstetrics & Gynecology

## 2014-04-29 VITALS — BP 104/62 | HR 60 | Resp 16 | Wt 142.2 lb

## 2014-04-29 DIAGNOSIS — Z30431 Encounter for routine checking of intrauterine contraceptive device: Secondary | ICD-10-CM

## 2014-04-29 NOTE — Progress Notes (Signed)
Patient ID: Ashley Mcclain, female   DOB: 01/30/1966, 48 y.o.   MRN: [id number]   48 yrs G2P2 Caucasian G2P2002LMP her for IUD recheck.  Mirena IUD placed 03/16/14.  Pt's cycle was much, much better.  She had no headache and bleeding was much improved.  She also had no pain.  Already so pleased with her IUD.  Still spotting.  This is almost daily.  No cramping.  Pt aware this is common and to expect this out for several more months, although it should continue to improve.      HPI neg.  Exam: WNWD WF, NAD Abdomen: soft non-tender Pelvic exam: VULVA: normal appearing vulva with no masses, tenderness or lesions, VAGINA: normal appearing vagina with normal color and discharge, no lesions, blood present, CERVIX: normal appearing cervix without discharge or lesions, 2cm IUD string noted, UTERUS: uterus is normal size, shape, consistency and nontender.   Assessment: IUD recheck Menorrhagia but now spotting  Plan: pt knows to call if she is having any spotting after six months from IUD placement.  AEX already scheduled 02/06/14.

## 2014-05-20 ENCOUNTER — Ambulatory Visit: Payer: 59 | Admitting: Obstetrics & Gynecology

## 2014-07-20 ENCOUNTER — Encounter: Payer: Self-pay | Admitting: Family Medicine

## 2014-07-25 ENCOUNTER — Ambulatory Visit: Payer: Self-pay | Admitting: Family Medicine

## 2014-07-25 VITALS — BP 110/58 | HR 56 | Ht 63.5 in | Wt 150.0 lb

## 2014-07-25 DIAGNOSIS — Z0000 Encounter for general adult medical examination without abnormal findings: Secondary | ICD-10-CM

## 2014-07-25 LAB — POCT URINALYSIS DIPSTICK
Bilirubin,Ur: NEGATIVE
Blood,UA POCT: NEGATIVE
Glucose,UA POCT: NORMAL
Ketones,UA POCT: NEGATIVE
Leuk Esterase,UA POCT: NEGATIVE
Lot #: 20339201
Nitrite,UA POCT: NEGATIVE
PH,UA POCT: 5 (ref 5–8)
Protein,UA POCT: NEGATIVE mg/dL
Specific gravity,UA POCT: 1.02 (ref 1.002–1.03)
Urobilinogen,UA: NORMAL

## 2014-07-25 NOTE — Progress Notes (Signed)
Subjective:     Patient ID: Madison HatterLisa Knight is a 49 y.o. female.    Gynecologic Exam  The patient's primary symptoms include missed menses. The patient's pertinent negatives include no genital odor, pelvic pain, vaginal bleeding or vaginal discharge. The patient is experiencing no pain. Associated symptoms include constipation. Pertinent negatives include no back pain, diarrhea, fever, headaches or nausea. She is sexually active. No, her partner does not have an STD. She uses oral contraceptives for contraception. Her menstrual history has been irregular. There is no history of menorrhagia, an STD or vaginosis.   She takes the OCPs continuous for 3 cycles. Does only sopt a couple times during the 3 months. Menses are mild and last 4 days.    Patient's medications, allergies, past medical, surgical, social and family histories were reviewed and updated as appropriate.    Review of Systems   Constitutional: Negative for fever, appetite change and fatigue.   HENT: Positive for congestion. Negative for ear pain, nosebleeds and sinus pressure.    Eyes: Negative for photophobia and pain.   Respiratory: Negative for cough and shortness of breath.    Cardiovascular: Negative for chest pain and palpitations.   Gastrointestinal: Positive for constipation. Negative for nausea and diarrhea.   Genitourinary: Positive for missed menses. Negative for vaginal discharge, difficulty urinating, genital sores, pelvic pain and menorrhagia.   Musculoskeletal: Positive for arthralgias. Negative for back pain and joint swelling.   Neurological: Negative for headaches.   Hematological: Negative for adenopathy.   Psychiatric/Behavioral: Positive for sleep disturbance.                     Objective:   Physical Exam   Constitutional: She is oriented to person, place, and time. She appears well-developed and well-nourished.   HENT:   Head: Normocephalic.   Right Ear: External ear normal.   Left Ear: External ear normal.   Nose: Nose normal.    Mouth/Throat: Oropharynx is clear and moist.   Eyes: EOM are normal. Pupils are equal, round, and reactive to light.   Neck: No JVD present. No thyromegaly present.   Cardiovascular: Normal rate.    No murmur heard.  Pulmonary/Chest: She has no wheezes. She has no rales.   Abdominal: She exhibits no distension and no mass. There is no tenderness.   Genitourinary: Vagina normal and uterus normal. No vaginal discharge found.   Musculoskeletal: She exhibits no tenderness.   Lymphadenopathy:     She has no cervical adenopathy.   Neurological: She is alert and oriented to person, place, and time. She has normal reflexes. No cranial nerve deficit.   Skin: No rash noted.   Psychiatric: She has a normal mood and affect.         Cervix normal. Mild abdominal tenderness.    Assessment:    Healthy Female/GYN Exam        Plan:   Discussed husband having Shingles at this time. She is to avoid contact.  Congratulated on maintaining exercise and weight.  Discussed diet.  Follow-up in 6 months.

## 2014-08-03 ENCOUNTER — Encounter: Payer: Self-pay | Admitting: Gastroenterology

## 2014-08-03 ENCOUNTER — Telehealth: Payer: Self-pay

## 2014-08-03 MED ORDER — FLUTICASONE PROPIONATE 50 MCG/ACT NA SUSP *I*
1.0000 | Freq: Every day | NASAL | Status: AC
Start: 2014-08-03 — End: 2015-01-30

## 2014-08-03 NOTE — Telephone Encounter (Signed)
RX sent; she was given a sample before

## 2014-08-03 NOTE — Telephone Encounter (Signed)
Information given to patient as per Dr. Peterson.

## 2014-08-03 NOTE — Telephone Encounter (Signed)
Patient has requested a [facility] for [facility] [place].

## 2014-08-03 NOTE — Telephone Encounter (Signed)
This is not listed in either system.

## 2014-08-04 ENCOUNTER — Encounter: Payer: Self-pay | Admitting: Family Medicine

## 2014-08-04 NOTE — Progress Notes (Signed)
Pt notified as per Dr. Vonita MossPeterson that  Labs drawn 08/03/14 ok.

## 2014-09-28 ENCOUNTER — Other Ambulatory Visit: Payer: Self-pay

## 2014-09-28 DIAGNOSIS — Z1231 Encounter for screening mammogram for malignant neoplasm of breast: Secondary | ICD-10-CM

## 2014-10-12 ENCOUNTER — Ambulatory Visit
Admission: RE | Admit: 2014-10-12 | Discharge: 2014-10-12 | Disposition: A | Discharge status: Home / Self Care | Payer: 59 | Source: Ambulatory Visit

## 2014-10-12 ENCOUNTER — Encounter (INDEPENDENT_AMBULATORY_CARE_PROVIDER_SITE_OTHER): Payer: Self-pay

## 2014-10-12 DIAGNOSIS — Z1231 Encounter for screening mammogram for malignant neoplasm of breast: Secondary | ICD-10-CM

## 2015-01-25 ENCOUNTER — Ambulatory Visit: Payer: Self-pay | Admitting: Family Medicine

## 2015-02-07 ENCOUNTER — Encounter: Payer: Self-pay | Admitting: Obstetrics & Gynecology

## 2015-02-07 ENCOUNTER — Ambulatory Visit (INDEPENDENT_AMBULATORY_CARE_PROVIDER_SITE_OTHER): Payer: 59 | Admitting: Obstetrics & Gynecology

## 2015-02-07 VITALS — BP 94/60 | HR 70 | Ht 63.5 in | Wt 141.0 lb

## 2015-02-07 DIAGNOSIS — Z01419 Encounter for gynecological examination (general) (routine) without abnormal findings: Secondary | ICD-10-CM

## 2015-02-07 DIAGNOSIS — Z124 Encounter for screening for malignant neoplasm of cervix: Secondary | ICD-10-CM | POA: Diagnosis not present

## 2015-02-07 NOTE — Patient Instructions (Addendum)
Dermatology:  Bufford Buttner, MD [address] [place], [place], [place] [address]  Phone: [phone]

## 2015-02-07 NOTE — Progress Notes (Signed)
49 y.o. [name] MarriedCaucasianF here for annual exam.  Doing well.  Reports she is very happy with the IUD.  Cycles are much lighter with spotting about two.  Pt reports she is not having any pain.  She is very pleased with this.    Feels like she's had blood work during the past year.  Has seen several individuals regarding some GI issues.      Patient's last menstrual period was 01/09/2015.          Sexually active: No.  The current method of family planning is IUD.    Exercising: Yes.    Cardio, weighs 4x/wk Smoker:  no  Health Maintenance: Pap:  08/04/2012 ASCUS neg hr hpv History of abnormal Pap:  Yes MMG:  10/12/2014 breast density category c; bi-rads 1: neg Colonoscopy: 04/01/2013 had 3 polyps, 2 were pre-cancerous and removed f/u due in 2019 BMD:   Never had one TDaP:  unsure Screening Labs: no , Hb today: PCP, Urine today: PCP   reports that she has never smoked. She has never used smokeless tobacco. She reports that she does not drink alcohol or use illicit drugs.  Past Medical History  Diagnosis Date  . Allergy     To grass  . Anemia     Mild and was told she had low B12 in the past  . Numbness in left leg 03/26/2013    Past Surgical History  Procedure Laterality Date  . Tonsilectomy, adenoidectomy, bilateral myringotomy and tubes    . Rectal polypectomy  2014    x3    Current Outpatient Prescriptions  Medication Sig Dispense Refill  . levonorgestrel (MIRENA) 20 MCG/24HR IUD 1 each by Intrauterine route once.    . Probiotic Product (ULTRAFLORA IMMUNE HEALTH PO) Take 2 tablets by mouth daily.     No current facility-administered medications for this visit.    Family History  Problem Relation Age of Onset  . Fibromyalgia Mother   . Arthritis Father   . Hypertension Brother   . Diabetes Maternal Grandfather   . Stroke Maternal Uncle   . Hypertension Father     ROS:  Pertinent items are noted in HPI.  Otherwise, a comprehensive ROS was negative.  Exam:    BP 94/60 mmHg  Pulse 70  Ht 5' 3.5" (1.613 m)  Wt 141 lb (63.957 kg)  BMI 24.58 kg/m2  LMP 01/09/2015  Weight change: +7#   Height: 5' 3.5" (161.3 cm)  Ht Readings from Last 3 Encounters:  02/07/15 5' 3.5" (1.613 m)  03/16/14 5\' 4"  (1.626 m)  02/15/14 5\' 4"  (1.626 m)    General appearance: alert, cooperative and appears stated age Head: Normocephalic, without obvious abnormality, atraumatic Neck: no adenopathy, supple, symmetrical, trachea midline and thyroid normal to inspection and palpation Lungs: clear to auscultation bilaterally Breasts: normal appearance, no masses or tenderness Heart: regular rate and rhythm Abdomen: soft, non-tender; bowel sounds normal; no masses,  no organomegaly Extremities: extremities normal, atraumatic, no cyanosis or edema Skin: Skin color, texture, turgor normal. No rashes or lesions Lymph nodes: Cervical, supraclavicular, and axillary nodes normal. No abnormal inguinal nodes palpated Neurologic: Grossly normal   Pelvic: External genitalia:  no lesions              Urethra:  normal appearing urethra with no masses, tenderness or lesions              Bartholins and Skenes: normal  Vagina: normal appearing vagina with normal color and discharge, no lesions              Cervix: no lesions              Pap taken: Yes.   Bimanual Exam:  Uterus:  normal size, contour, position, consistency, mobility, non-tender              Adnexa: normal adnexa and no mass, fullness, tenderness               Rectovaginal: Declined due to number of prior rectal exams done this year               Anus:  normal sphincter tone, no lesions  Chaperone was present for exam.  A:  Well Woman with normal exam H/O menorrhagia much improved with Mirena IUD placed 03/16/14 H/O loose stools, seeing GI, making dietary changes H/O ASCUS pap with neg HR HPV 2014  P:   Mammogram yearly.  Grade 3 density.  Had 3D MMG. pap smear today Labs done in last year with  specialist.  Plan screening labs next year here or with PCP (Dr. Modesto Charon). return annually or prn

## 2015-02-10 LAB — IPS PAP TEST WITH REFLEX TO HPV

## 2015-06-22 ENCOUNTER — Other Ambulatory Visit: Payer: Self-pay | Admitting: Family Medicine

## 2015-06-22 NOTE — Telephone Encounter (Signed)
Her script was sent. Please set up fpr a PE she will be due in February. Thank you.

## 2015-06-23 NOTE — Telephone Encounter (Signed)
Called and left message for Madison StanleyLisa to call office back

## 2016-05-23 NOTE — Progress Notes (Signed)
50 y.o. [id number] MarriedCaucasianF here for annual exam.  Doing well.  Having hip replacement in April with Dr. Despina HickAlusio.    She does not have vaginal bleeding.  Had Mirena IUD placed 03/16/14.          Sexually active: No.  The current method of family planning is IUD.    Exercising: Yes.    aerobics and weight lifting Smoker:  no  Health Maintenance: Pap:  02/07/15 negative  History of abnormal Pap:  yes MMG:  10/12/14 BIRADS 1 negative  Colonoscopy:  04/01/13 polyps- repeat 5 years.  Dr. Loreta AveMann. BMD:   Never  TDaP:  unsure Pneumonia vaccine(s):  never Zostavax:   never Hep C testing: not indicated  Screening Labs: drawn today, Hb today: drawn today, Urine today: declined   reports that she has never smoked. She has never used smokeless tobacco. She reports that she does not drink alcohol or use drugs.  Past Medical History:  Diagnosis Date  . Allergy    To grass  . Anemia    Mild and was told she had low B12 in the past  . Numbness in left leg 03/26/2013    Past Surgical History:  Procedure Laterality Date  . RECTAL POLYPECTOMY  2014   x3  . TONSILECTOMY, ADENOIDECTOMY, BILATERAL MYRINGOTOMY AND TUBES      Current Outpatient Prescriptions  Medication Sig Dispense Refill  . levonorgestrel (MIRENA) 20 MCG/24HR IUD 1 each by Intrauterine route once.    . NON FORMULARY GI repair powder    . TURMERIC PO Take by mouth daily.     No current facility-administered medications for this visit.     Family History  Problem Relation Age of Onset  . Fibromyalgia Mother   . Arthritis Father   . Hypertension Brother   . Diabetes Maternal Grandfather   . Stroke Maternal Uncle   . Hypertension Father     ROS:  Pertinent items are noted in HPI.  Otherwise, a comprehensive ROS was negative.  Exam:   BP 112/62 (BP Location: Right Arm, Patient Position: Sitting, Cuff Size: Normal)   Pulse 64   Resp 12   Ht 5\' 4"  (1.626 m)   Wt 140 lb (63.5 kg)   BMI 24.03 kg/m   Weight  change: -1#  Height: 5\' 4"  (162.6 cm)  Ht Readings from Last 3 Encounters:  05/24/16 5\' 4"  (1.626 m)  02/07/15 5' 3.5" (1.613 m)  03/16/14 5\' 4"  (1.626 m)   General appearance: alert, cooperative and appears stated age Head: Normocephalic, without obvious abnormality, atraumatic Neck: no adenopathy, supple, symmetrical, trachea midline and thyroid normal to inspection and palpation Lungs: clear to auscultation bilaterally Breasts: normal appearance, no masses or tenderness Heart: regular rate and rhythm Abdomen: soft, non-tender; bowel sounds normal; no masses,  no organomegaly Extremities: extremities normal, atraumatic, no cyanosis or edema Skin: Skin color, texture, turgor normal. No rashes or lesions Lymph nodes: Cervical, supraclavicular, and axillary nodes normal. No abnormal inguinal nodes palpated Neurologic: Grossly normal  Pelvic: External genitalia:  no lesions              Urethra:  normal appearing urethra with no masses, tenderness or lesions              Bartholins and Skenes: normal                 Vagina: normal appearing vagina with normal color and discharge, no lesions  Cervix: no lesions, cervical polyp noted              Pap taken: Yes.   Bimanual Exam:  Uterus:  normal size, contour, position, consistency, mobility, non-tender              Adnexa: normal adnexa and no mass, fullness, tenderness               Rectovaginal: Confirms               Anus:  normal sphincter tone, no lesions  Chaperone was present for exam.  A:   Well woman with normal exam H/O menorrhagia with amenorrhea since Mirena place 03/24/16 H/O ASCUS pap with neg HR HPV 2014 H/O loose stools that she has improved on own with elimination Cervical polyp  P:         Mammogram yearly.  Grade 3 density.  Had 3D MMG.  Pt knows she should schedule this. pap smear and HR HPV obtained today CBC, CMP, TSH, Vit D, lipids Removal of cervical polyp Return annually or prn

## 2016-05-24 ENCOUNTER — Encounter: Payer: Self-pay | Admitting: Obstetrics & Gynecology

## 2016-05-24 ENCOUNTER — Ambulatory Visit (INDEPENDENT_AMBULATORY_CARE_PROVIDER_SITE_OTHER): Payer: 59 | Admitting: Obstetrics & Gynecology

## 2016-05-24 VITALS — BP 112/62 | HR 64 | Resp 12 | Ht 64.0 in | Wt 140.0 lb

## 2016-05-24 DIAGNOSIS — N841 Polyp of cervix uteri: Secondary | ICD-10-CM

## 2016-05-24 DIAGNOSIS — Z124 Encounter for screening for malignant neoplasm of cervix: Secondary | ICD-10-CM | POA: Diagnosis not present

## 2016-05-24 DIAGNOSIS — Z0000 Encounter for general adult medical examination without abnormal findings: Secondary | ICD-10-CM

## 2016-05-24 DIAGNOSIS — Z01419 Encounter for gynecological examination (general) (routine) without abnormal findings: Secondary | ICD-10-CM

## 2016-05-24 DIAGNOSIS — N920 Excessive and frequent menstruation with regular cycle: Secondary | ICD-10-CM

## 2016-05-24 LAB — COMPREHENSIVE METABOLIC PANEL
ALT: 8 U/L (ref 6–29)
AST: 13 U/L (ref 10–35)
Albumin: 3.9 g/dL (ref 3.6–5.1)
Alkaline Phosphatase: 46 U/L (ref 33–115)
BUN: 14 mg/dL (ref 7–25)
CHLORIDE: 107 mmol/L (ref 98–110)
CO2: 30 mmol/L (ref 20–31)
Calcium: 9 mg/dL (ref 8.6–10.2)
Creat: 0.9 mg/dL (ref 0.50–1.10)
Glucose, Bld: 67 mg/dL (ref 65–99)
POTASSIUM: 4.1 mmol/L (ref 3.5–5.3)
Sodium: 141 mmol/L (ref 135–146)
Total Bilirubin: 0.3 mg/dL (ref 0.2–1.2)
Total Protein: 6.3 g/dL (ref 6.1–8.1)

## 2016-05-24 LAB — CBC
HEMATOCRIT: 37.7 % (ref 35.0–45.0)
Hemoglobin: 12.6 g/dL (ref 11.7–15.5)
MCH: 30.2 pg (ref 27.0–33.0)
MCHC: 33.4 g/dL (ref 32.0–36.0)
MCV: 90.4 fL (ref 80.0–100.0)
MPV: 10.1 fL (ref 7.5–12.5)
Platelets: 255 10*3/uL (ref 140–400)
RBC: 4.17 MIL/uL (ref 3.80–5.10)
RDW: 12.8 % (ref 11.0–15.0)
WBC: 6.9 10*3/uL (ref 3.8–10.8)

## 2016-05-24 LAB — LIPID PANEL
CHOLESTEROL: 139 mg/dL (ref <200)
HDL: 51 mg/dL (ref >50)
LDL Cholesterol: 73 mg/dL (ref <100)
TRIGLYCERIDES: 76 mg/dL (ref <150)
Total CHOL/HDL Ratio: 2.7 Ratio (ref <5.0)
VLDL: 15 mg/dL (ref <30)

## 2016-05-24 LAB — TSH: TSH: 3.13 mIU/L

## 2016-05-25 LAB — VITAMIN D 25 HYDROXY (VIT D DEFICIENCY, FRACTURES): VIT D 25 HYDROXY: 42 ng/mL (ref 30–100)

## 2016-05-27 LAB — PATHOLOGY

## 2016-05-29 LAB — IPS PAP TEST WITH HPV

## 2016-05-30 ENCOUNTER — Telehealth: Payer: Self-pay | Admitting: *Deleted

## 2016-05-30 NOTE — Telephone Encounter (Signed)
Message left for patient that cervical polyp was benign per DPR. Instructed patient to call if she had any questions.    Routing to provider for final review. Patient agreeable to disposition. Will close encounter.

## 2016-06-07 ENCOUNTER — Telehealth: Payer: Self-pay | Admitting: *Deleted

## 2016-06-07 NOTE — Telephone Encounter (Signed)
-----   Message from Jerene BearsMary S Miller, MD sent at 05/31/2016  5:13 PM EST ----- Please let pt know her pap came back as ASCUS but with neg HR HPV.  This is a NORMAL pap smear.  Needs 08 recall for 3 years.

## 2016-06-07 NOTE — Telephone Encounter (Signed)
Message left to return call to Winner Valeriano at [phone].    

## 2016-06-13 NOTE — Telephone Encounter (Signed)
Call to patient. Patient notified of pap smear results. Patient verbalized understanding.    Routing to provider for final review. Patient agreeable to disposition. Will close encounter.

## 2016-06-24 ENCOUNTER — Telehealth: Payer: Self-pay | Admitting: *Deleted

## 2016-06-24 NOTE — Telephone Encounter (Signed)
-----   Message from Jerene BearsMary S Miller, MD sent at 06/24/2016  6:27 AM EST ----- Regarding: vaccine records Can you please call pt and let her know that I never got vaccine records from her PCP?  So, I don't know when she had her last tetanus and I'd recommend she update that this year.  Please make phone note.  Thanks.  Rosalita ChessmanSuzanne

## 2016-06-24 NOTE — Telephone Encounter (Signed)
Patient returned call. Message given to patient as seen below from Dr. Hyacinth MeekerMiller. Patient verbalized understanding. Patient states she has not seen her PCP in a while, but that Laurann Montanaynthia White at [facility] would have been the last MD she would have seen. Will request vaccines records.    Routing to provider for final review. Patient agreeable to disposition. Will close encounter.

## 2016-06-24 NOTE — Telephone Encounter (Signed)
Message left to return call to Tilda Samudio at [phone].    

## 2016-06-24 NOTE — Telephone Encounter (Signed)
Left message per DPR that patient will need to stop by the office and sign a release of records so that we can get vaccine record from [facility].

## 2016-08-23 ENCOUNTER — Encounter (HOSPITAL_COMMUNITY): Payer: Self-pay | Admitting: *Deleted

## 2016-09-05 ENCOUNTER — Ambulatory Visit: Payer: Self-pay | Admitting: Orthopedic Surgery

## 2016-09-05 NOTE — Progress Notes (Signed)
Please place orders in EPIC as patient is being scheduled for a Pre-op appointment! Thank you! 

## 2016-09-12 ENCOUNTER — Other Ambulatory Visit: Payer: Self-pay | Admitting: Orthopedic Surgery

## 2016-09-20 ENCOUNTER — Other Ambulatory Visit: Payer: Self-pay | Admitting: Orthopedic Surgery

## 2016-09-21 ENCOUNTER — Ambulatory Visit: Payer: Self-pay | Admitting: Orthopedic Surgery

## 2016-09-21 NOTE — H&P (Signed)
Ashley Mcclain DOB: Nov 18, 1965 Divorced / Language: [place] / Race: White Female Date of Admission:  10/02/2016 CC:  Right Hip Pain History of Present Illness   The patient is a 51 year old female who comes in  for a preoperative History and Physical. The patient is scheduled for a right total hip arthroplasty (anterior) to be performed by Dr. Gus Rankin. Aluisio, MD at [facility] on 10-02-2016. The patient is a 51 year old female who presented for follow up of their hip. The patient is being followed for their right hip pain and osteoarthritis. They are year(s) out from when symptoms began. Symptoms reported include: pain, pain at night, aching (weakness when doing squats at the gym), stiffness, pain with weightbearing and difficulty ambulating. The patient feels that they are doing poorly and report their pain level to be moderate. Current treatment includes: relative rest. The following medication has been used for pain control: antiinflammatory medication (ibuprofen prn). The patient has not gotten any relief of their symptoms with Cortisone injections (IA injection: did not help). Unfortunately, her right hip has gotten progressively worse. It is now bothering her with all activities. It is limiting what she can and cannot do. She would like to be much more active, but the hip is preventing her from doing so. Pain is in her groin and anterior thigh. She is not having any back pain with this. Left hip currently is not hurting. She is ready to get the hip fixed. They have been treated conservatively in the past for the above stated problem and despite conservative measures, they continue to have progressive pain and severe functional limitations and dysfunction. They have failed non-operative management including home exercise, medications, and injections. It is felt that they would benefit from undergoing total joint replacement. Risks and benefits of the procedure have been discussed with the  patient and they elect to proceed with surgery. There are no active contraindications to surgery such as ongoing infection or rapidly progressive neurological disease.  Problem List/Past Medical Parasthesia/Numbness (782.0)  Arthralgia of right hip (M25.551)  Primary osteoarthritis of right hip (M16.11)  Anemia  Hemorrhoids  Cystitis   Allergies No Known Drug Allergies   Family History  Rheumatoid Arthritis  Father. father Hypertension  First Degree Relatives. brother  Social History  Current occupation  Best boy Marital status  divorced Living situation  live alone Illicit drug use  no Pain Contract  no Number of flights of stairs before winded  greater than 5 Current work status  working full time Children  2 Alcohol use  former drinker Exercise  Exercises daily; does running / walking, other, gym / [employer] and team sport Drug/Alcohol Rehab (Previously)  no Drug/Alcohol Rehab (Currently)  no Tobacco use  Never smoker. never smoker Post-Surgical Plans  Home with family  Medication History Ibuprofen (  Capsule, 1 (one) Oral) Active. Tramadol Active. Turmeric Active. Multivitamin Active.  Past Surgical History Tonsillectomy   Review of Systems  General Not Present- Chills, Fatigue, Fever, Memory Loss, Night Sweats, Weight Gain and Weight Loss. Skin Not Present- Eczema, Hives, Itching, Lesions and Rash. HEENT Not Present- Dentures, Double Vision, Headache, Hearing Loss, Tinnitus and Visual Loss. Respiratory Not Present- Allergies, Chronic Cough, Coughing up blood, Shortness of breath at rest and Shortness of breath with exertion. Cardiovascular Not Present- Chest Pain, Difficulty Breathing Lying Down, Murmur, Palpitations, Racing/skipping heartbeats and Swelling. Gastrointestinal Present- Diarrhea. Not Present- Abdominal Pain, Bloody Stool, Constipation, Difficulty Swallowing, Heartburn, Jaundice, Loss of appetitie, Nausea  and  Vomiting. Female Genitourinary Not Present- Blood in Urine, Discharge, Flank Pain, Incontinence, Painful Urination, Urgency, Urinary frequency, Urinary Retention, Urinating at Night and Weak urinary stream. Musculoskeletal Present- Joint Pain. Not Present- Back Pain, Joint Swelling, Morning Stiffness, Muscle Pain, Muscle Weakness and Spasms. Neurological Not Present- Blackout spells, Difficulty with balance, Dizziness, Paralysis, Tremor and Weakness. Psychiatric Not Present- Insomnia.  Vitals  Weight: 140 lb Height: 64in Weight was reported by patient. Height was reported by patient. Body Surface Area: 1.68 m Body Mass Index: 24.03 kg/m  Pulse: 68 (Regular)  BP: 118/64 (Sitting, Right Arm, Standard)  Physical Exam  General Mental Status -Alert, cooperative and good historian. General Appearance-pleasant, Not in acute distress. Orientation-Oriented X3. Build & Nutrition-Well nourished and Well developed.  Head and Neck Head-normocephalic, atraumatic . Neck Global Assessment - supple, no bruit auscultated on the right, no bruit auscultated on the left.  Eye Pupil - Bilateral-Regular and Round. Motion - Bilateral-EOMI.  Chest and Lung Exam Auscultation Breath sounds - clear at anterior chest wall and clear at posterior chest wall. Adventitious sounds - No Adventitious sounds.  Cardiovascular Auscultation Rhythm - Regular rate and rhythm. Heart Sounds - S1 WNL and S2 WNL. Murmurs & Other Heart Sounds - Auscultation of the heart reveals - No Murmurs.  Abdomen Palpation/Percussion Tenderness - Abdomen is non-tender to palpation. Rigidity (guarding) - Abdomen is soft. Auscultation Auscultation of the abdomen reveals - Bowel sounds normal.  Female Genitourinary Note: Not done, not pertinent to present illness   Musculoskeletal Note: Well developed female in no distress. Evaluation of her left hip, normal range of motion, no discomfort. Right hip can  be flexed to about 100, no internal rotation, about 20 external rotation, 20 abduction. She has a significantly antalgic gait pattern on the right. Pulse, sensation, motor intact distally.  RADIOGRAPHS AP pelvis and lateral of the right hip show bone on bone change with subchondral cystic formation.   Assessment & Plan Primary osteoarthritis of right hip (M16.11)  Note:Surgical Plans: Right Total Hip Replacement - Anterior Approach  Disposition: Home with family  PCP: Dr. Juluis Rainier - pending at time of H&P  IV TXA  Anesthesia Issues: None  Patient was instructed on what medications to stop prior to surgery.  Signed electronically by Lauraine Rinne, III PA-C

## 2016-09-24 ENCOUNTER — Other Ambulatory Visit (HOSPITAL_COMMUNITY): Payer: Self-pay | Admitting: Emergency Medicine

## 2016-09-24 NOTE — Patient Instructions (Addendum)
Ashley Mcclain  09/24/2016   Your procedure is scheduled on: 10-02-16  Report to [facility] Main  Entrance     Report to admitting at 12PM   Call this number if you have problems the morning of surgery [phone]   Remember: ONLY 1 PERSON MAY GO WITH YOU TO SHORT STAY TO GET  READY MORNING OF YOUR SURGERY.    Do not eat food After Midnight. You may have clear liquids from midnight until 8am day of surgery. Nothing by mouth after 8am!!     Take these medicines the morning of surgery with A SIP OF WATER: tramadol as needed                                You may not have any metal on your body including hair pins and              piercings  Do not wear jewelry, make-up, lotions, powders or perfumes, deodorant             Do not wear nail polish.  Do not shave  48 hours prior to surgery.       Do not bring valuables to the hospital. [facility] IS NOT             RESPONSIBLE   FOR VALUABLES.  Contacts, dentures or bridgework may not be worn into surgery.  Leave suitcase in the car. After surgery it may be brought to your room.                 Please read over the following fact sheets you were given: _____________________________________________________________________                CLEAR LIQUID DIET   Foods Allowed                                                                     Foods Excluded  Coffee and tea, regular and decaf                             liquids that you cannot  Plain Jell-O in any flavor                                             see through such as: Fruit ices (not with fruit pulp)                                     milk, soups, orange juice  Iced Popsicles                                    All solid food Carbonated beverages, regular and diet  Cranberry, grape and apple juices Sports drinks like Gatorade Lightly seasoned clear broth or consume(fat free) Sugar, honey syrup  Sample  Menu Breakfast                                Lunch                                     Supper Cranberry juice                    Beef broth                            Chicken broth Jell-O                                     Grape juice                           Apple juice Coffee or tea                        Jell-O                                      Popsicle                                                Coffee or tea                        Coffee or tea  _____________________________________________________________________  [facility] - Preparing for Surgery Before surgery, you can play an important role.  Because skin is not sterile, your skin needs to be as free of germs as possible.  You can reduce the number of germs on your skin by washing with CHG (chlorahexidine gluconate) soap before surgery.  CHG is an antiseptic cleaner which kills germs and bonds with the skin to continue killing germs even after washing. Please DO NOT use if you have an allergy to CHG or antibacterial soaps.  If your skin becomes reddened/irritated stop using the CHG and inform your nurse when you arrive at Short Stay. Do not shave (including legs and underarms) for at least 48 hours prior to the first CHG shower.  You may shave your face/neck. Please follow these instructions carefully:  1.  Shower with CHG Soap the night before surgery and the  morning of Surgery.  2.  If you choose to wash your hair, wash your hair first as usual with your  normal  shampoo.  3.  After you shampoo, rinse your hair and body thoroughly to remove the  shampoo.                           4.  Use CHG as you would any other liquid soap.  You can apply chg directly  to the skin and wash  Gently with a scrungie or clean washcloth.  5.  Apply the CHG Soap to your body ONLY FROM THE NECK DOWN.   Do not use on face/ open                           Wound or open sores. Avoid contact with eyes, ears mouth and genitals  (private parts).                       Wash face,  Genitals (private parts) with your normal soap.             6.  Wash thoroughly, paying special attention to the area where your surgery  will be performed.  7.  Thoroughly rinse your body with warm water from the neck down.  8.  DO NOT shower/wash with your normal soap after using and rinsing off  the CHG Soap.                9.  Pat yourself dry with a clean towel.            10.  Wear clean pajamas.            11.  Place clean sheets on your bed the night of your first shower and do not  sleep with pets. Day of Surgery : Do not apply any lotions/deodorants the morning of surgery.  Please wear clean clothes to the hospital/surgery center.  FAILURE TO FOLLOW THESE INSTRUCTIONS MAY RESULT IN THE CANCELLATION OF YOUR SURGERY PATIENT SIGNATURE_________________________________  NURSE SIGNATURE__________________________________  ________________________________________________________________________   Ashley Mcclain  An incentive spirometer is a tool that can help keep your lungs clear and active. This tool measures how well you are filling your lungs with each breath. Taking long deep breaths may help reverse or decrease the chance of developing breathing (pulmonary) problems (especially infection) following:  A long period of time when you are unable to move or be active. BEFORE THE PROCEDURE   If the spirometer includes an indicator to show your best effort, your nurse or respiratory therapist will set it to a desired goal.  If possible, sit up straight or lean slightly forward. Try not to slouch.  Hold the incentive spirometer in an upright position. INSTRUCTIONS FOR USE  1. Sit on the edge of your bed if possible, or sit up as far as you can in bed or on a chair. 2. Hold the incentive spirometer in an upright position. 3. Breathe out normally. 4. Place the mouthpiece in your mouth and seal your lips tightly around  it. 5. Breathe in slowly and as deeply as possible, raising the piston or the ball toward the top of the column. 6. Hold your breath for 3-5 seconds or for as long as possible. Allow the piston or ball to fall to the bottom of the column. 7. Remove the mouthpiece from your mouth and breathe out normally. 8. Rest for a few seconds and repeat Steps 1 through 7 at least 10 times every 1-2 hours when you are awake. Take your time and take a few normal breaths between deep breaths. 9. The spirometer may include an indicator to show your best effort. Use the indicator as a goal to work toward during each repetition. 10. After each set of 10 deep breaths, practice coughing to be sure your lungs are clear. If you have an incision (the cut made at the time of  surgery), support your incision when coughing by placing a pillow or rolled up towels firmly against it. Once you are able to get out of bed, walk around indoors and cough well. You may stop using the incentive spirometer when instructed by your caregiver.  RISKS AND COMPLICATIONS  Take your time so you do not get dizzy or light-headed.  If you are in pain, you may need to take or ask for pain medication before doing incentive spirometry. It is harder to take a deep breath if you are having pain. AFTER USE  Rest and breathe slowly and easily.  It can be helpful to keep track of a log of your progress. Your caregiver can provide you with a simple table to help with this. If you are using the spirometer at home, follow these instructions: Horace IF:   You are having difficultly using the spirometer.  You have trouble using the spirometer as often as instructed.  Your pain medication is not giving enough relief while using the spirometer.  You develop fever of 100.5 F (38.1 C) or higher. SEEK IMMEDIATE MEDICAL CARE IF:   You cough up bloody sputum that had not been present before.  You develop fever of 102 F (38.9 C) or  greater.  You develop worsening pain at or near the incision site. MAKE SURE YOU:   Understand these instructions.  Will watch your condition.  Will get help right away if you are not doing well or get worse. Document Released: 10/07/2006 Document Revised: 08/19/2011 Document Reviewed: 12/08/2006 Exit[employer] Patient Information 2014 Exit[employer], [place].   ________________________________________________________________________  WHAT IS A BLOOD TRANSFUSION? Blood Transfusion Information  A transfusion is the replacement of blood or some of its parts. Blood is made up of multiple cells which provide different functions.  Red blood cells carry oxygen and are used for blood loss replacement.  White blood cells fight against infection.  Platelets control bleeding.  Plasma helps clot blood.  Other blood products are available for specialized needs, such as hemophilia or other clotting disorders. BEFORE THE TRANSFUSION  Who gives blood for transfusions?   Healthy volunteers who are fully evaluated to make sure their blood is safe. This is blood bank blood. Transfusion therapy is the safest it has ever been in the practice of medicine. Before blood is taken from a donor, a complete history is taken to make sure that person has no history of diseases nor engages in risky social behavior (examples are intravenous drug use or sexual activity with multiple partners). The donor's travel history is screened to minimize risk of transmitting infections, such as malaria. The donated blood is tested for signs of infectious diseases, such as HIV and hepatitis. The blood is then tested to be sure it is compatible with you in order to minimize the chance of a transfusion reaction. If you or a relative donates blood, this is often done in anticipation of surgery and is not appropriate for emergency situations. It takes many days to process the donated blood. RISKS AND COMPLICATIONS Although transfusion therapy  is very safe and saves many lives, the main dangers of transfusion include:   Getting an infectious disease.  Developing a transfusion reaction. This is an allergic reaction to something in the blood you were given. Every precaution is taken to prevent this. The decision to have a blood transfusion has been considered carefully by your caregiver before blood is given. Blood is not given unless the benefits outweigh the risks. AFTER THE  TRANSFUSION  Right after receiving a blood transfusion, you will usually feel much better and more energetic. This is especially true if your red blood cells have gotten low (anemic). The transfusion raises the level of the red blood cells which carry oxygen, and this usually causes an energy increase.  The nurse administering the transfusion will monitor you carefully for complications. HOME CARE INSTRUCTIONS  No special instructions are needed after a transfusion. You may find your energy is better. Speak with your caregiver about any limitations on activity for underlying diseases you may have. SEEK MEDICAL CARE IF:   Your condition is not improving after your transfusion.  You develop redness or irritation at the intravenous (IV) site. SEEK IMMEDIATE MEDICAL CARE IF:  Any of the following symptoms occur over the next 12 hours:  Shaking chills.  You have a temperature by mouth above 102 F (38.9 C), not controlled by medicine.  Chest, back, or muscle pain.  People around you feel you are not acting correctly or are confused.  Shortness of breath or difficulty breathing.  Dizziness and fainting.  You get a rash or develop hives.  You have a decrease in urine output.  Your urine turns a dark color or changes to pink, red, or brown. Any of the following symptoms occur over the next 10 days:  You have a temperature by mouth above 102 F (38.9 C), not controlled by medicine.  Shortness of breath.  Weakness after normal activity.  The white  part of the eye turns yellow (jaundice).  You have a decrease in the amount of urine or are urinating less often.  Your urine turns a dark color or changes to pink, red, or brown. Document Released: 05/24/2000 Document Revised: 08/19/2011 Document Reviewed: 01/11/2008 [facility] Patient Information 2014 [place], [place].  _______________________________________________________________________

## 2016-09-25 ENCOUNTER — Encounter (HOSPITAL_COMMUNITY)
Admission: RE | Admit: 2016-09-25 | Discharge: 2016-09-25 | Disposition: A | Discharge status: Home / Self Care | Payer: 59 | Source: Ambulatory Visit | Attending: Orthopedic Surgery | Admitting: Orthopedic Surgery

## 2016-09-25 ENCOUNTER — Encounter (INDEPENDENT_AMBULATORY_CARE_PROVIDER_SITE_OTHER): Payer: Self-pay

## 2016-09-25 ENCOUNTER — Encounter (HOSPITAL_COMMUNITY): Payer: Self-pay

## 2016-09-25 DIAGNOSIS — Z01812 Encounter for preprocedural laboratory examination: Secondary | ICD-10-CM | POA: Insufficient documentation

## 2016-09-25 DIAGNOSIS — M1611 Unilateral primary osteoarthritis, right hip: Secondary | ICD-10-CM | POA: Insufficient documentation

## 2016-09-25 HISTORY — DX: Other specified effects of external causes, initial encounter: T75.89XA

## 2016-09-25 LAB — COMPREHENSIVE METABOLIC PANEL
ALK PHOS: 43 U/L (ref 38–126)
ALT: 14 U/L (ref 14–54)
ANION GAP: 7 (ref 5–15)
AST: 19 U/L (ref 15–41)
Albumin: 4.4 g/dL (ref 3.5–5.0)
BILIRUBIN TOTAL: 0.9 mg/dL (ref 0.3–1.2)
BUN: 14 mg/dL (ref 6–20)
CALCIUM: 9.1 mg/dL (ref 8.9–10.3)
CO2: 27 mmol/L (ref 22–32)
Chloride: 105 mmol/L (ref 101–111)
Creatinine, Ser: 0.74 mg/dL (ref 0.44–1.00)
GFR calc Af Amer: >60 mL/min (ref >60)
GLUCOSE: 92 mg/dL (ref 65–99)
Potassium: 4.2 mmol/L (ref 3.5–5.1)
Sodium: 139 mmol/L (ref 135–145)
TOTAL PROTEIN: 7.2 g/dL (ref 6.5–8.1)

## 2016-09-25 LAB — CBC
HEMATOCRIT: 39 % (ref 36.0–46.0)
HEMOGLOBIN: 13.2 g/dL (ref 12.0–15.0)
MCH: 31.1 pg (ref 26.0–34.0)
MCHC: 33.8 g/dL (ref 30.0–36.0)
MCV: 91.8 fL (ref 78.0–100.0)
Platelets: 252 10*3/uL (ref 150–400)
RBC: 4.25 MIL/uL (ref 3.87–5.11)
RDW: 12.5 % (ref 11.5–15.5)
WBC: 5.3 10*3/uL (ref 4.0–10.5)

## 2016-09-25 LAB — APTT: aPTT: 28 seconds (ref 24–36)

## 2016-09-25 LAB — ABO/RH: ABO/RH(D): A NEG

## 2016-09-25 LAB — SURGICAL PCR SCREEN
MRSA, PCR: NEGATIVE
Staphylococcus aureus: NEGATIVE

## 2016-09-25 LAB — PROTIME-INR
INR: 0.94
PROTHROMBIN TIME: 12.6 s (ref 11.4–15.2)

## 2016-09-25 LAB — HCG, SERUM, QUALITATIVE: Preg, Serum: NEGATIVE

## 2016-10-02 ENCOUNTER — Inpatient Hospital Stay (HOSPITAL_COMMUNITY): Payer: 59 | Admitting: Anesthesiology

## 2016-10-02 ENCOUNTER — Encounter (HOSPITAL_COMMUNITY)
Admission: RE | Disposition: A | Discharge status: Home Health | Payer: Self-pay | Source: Ambulatory Visit | Attending: Orthopedic Surgery

## 2016-10-02 ENCOUNTER — Encounter (HOSPITAL_COMMUNITY): Payer: Self-pay | Admitting: Anesthesiology

## 2016-10-02 ENCOUNTER — Inpatient Hospital Stay (HOSPITAL_COMMUNITY): Payer: 59

## 2016-10-02 ENCOUNTER — Inpatient Hospital Stay (HOSPITAL_COMMUNITY)
Admission: RE | Admit: 2016-10-02 | Discharge: 2016-10-03 | DRG: 470 | Disposition: A | Discharge status: Home Health | Payer: 59 | Source: Ambulatory Visit | Attending: Orthopedic Surgery | Admitting: Orthopedic Surgery

## 2016-10-02 DIAGNOSIS — M1611 Unilateral primary osteoarthritis, right hip: Principal | ICD-10-CM | POA: Diagnosis present

## 2016-10-02 DIAGNOSIS — R200 Anesthesia of skin: Secondary | ICD-10-CM | POA: Diagnosis present

## 2016-10-02 DIAGNOSIS — M25551 Pain in right hip: Secondary | ICD-10-CM | POA: Diagnosis present

## 2016-10-02 DIAGNOSIS — M169 Osteoarthritis of hip, unspecified: Secondary | ICD-10-CM

## 2016-10-02 DIAGNOSIS — Z96649 Presence of unspecified artificial hip joint: Secondary | ICD-10-CM

## 2016-10-02 HISTORY — PX: TOTAL HIP ARTHROPLASTY: SHX124

## 2016-10-02 LAB — TYPE AND SCREEN
ABO/RH(D): A NEG
Antibody Screen: NEGATIVE

## 2016-10-02 SURGERY — ARTHROPLASTY, HIP, TOTAL, ANTERIOR APPROACH
Anesthesia: Spinal | Site: Hip | Laterality: Right

## 2016-10-02 MED ORDER — ONDANSETRON HCL 4 MG/2ML IJ SOLN
INTRAMUSCULAR | Status: AC
  Filled 2016-10-02: qty 2

## 2016-10-02 MED ORDER — OXYCODONE HCL 5 MG/5ML PO SOLN: 5.0000 mg | Freq: Once | ORAL | Status: DC | PRN

## 2016-10-02 MED ORDER — TRAMADOL HCL 50 MG PO TABS: 50.0000 mg | ORAL_TABLET | Freq: Four times a day (QID) | ORAL | Status: DC | PRN

## 2016-10-02 MED ORDER — METHOCARBAMOL 1000 MG/10ML IJ SOLN
500.0000 mg | Freq: Four times a day (QID) | INTRAVENOUS | Status: DC | PRN
  Administered 2016-10-02: 21:00:00 500 mg via INTRAVENOUS
  Filled 2016-10-02: qty 550

## 2016-10-02 MED ORDER — FENTANYL CITRATE (PF) 100 MCG/2ML IJ SOLN
INTRAMUSCULAR | Status: AC
  Filled 2016-10-02: qty 2

## 2016-10-02 MED ORDER — CEFAZOLIN SODIUM-DEXTROSE 2-4 GM/100ML-% IV SOLN
2.0000 g | INTRAVENOUS | Status: AC
  Administered 2016-10-02: 2 g via INTRAVENOUS

## 2016-10-02 MED ORDER — OXYCODONE HCL 5 MG PO TABS: 5.0000 mg | ORAL_TABLET | Freq: Once | ORAL | Status: DC | PRN

## 2016-10-02 MED ORDER — METHOCARBAMOL 500 MG PO TABS
500.0000 mg | ORAL_TABLET | Freq: Four times a day (QID) | ORAL | Status: DC | PRN
  Administered 2016-10-03: 500 mg via ORAL
  Filled 2016-10-02: qty 1

## 2016-10-02 MED ORDER — DEXAMETHASONE SODIUM PHOSPHATE 10 MG/ML IJ SOLN
INTRAMUSCULAR | Status: AC
  Filled 2016-10-02: qty 1

## 2016-10-02 MED ORDER — SODIUM CHLORIDE 0.9 % IV SOLN
1000.0000 mg | INTRAVENOUS | Status: AC
  Administered 2016-10-02: 1000 mg via INTRAVENOUS
  Filled 2016-10-02: qty 1100

## 2016-10-02 MED ORDER — PROPOFOL 500 MG/50ML IV EMUL
INTRAVENOUS | Status: DC | PRN
  Administered 2016-10-02: 120 ug/kg/min via INTRAVENOUS

## 2016-10-02 MED ORDER — SODIUM CHLORIDE 0.9 % IV SOLN
INTRAVENOUS | Status: DC
  Administered 2016-10-02: 19:00:00 via INTRAVENOUS
  Administered 2016-10-02: 1000 mL via INTRAVENOUS

## 2016-10-02 MED ORDER — ONDANSETRON HCL 4 MG/2ML IJ SOLN: 4.0000 mg | Freq: Four times a day (QID) | INTRAMUSCULAR | Status: DC | PRN

## 2016-10-02 MED ORDER — RIVAROXABAN 10 MG PO TABS
10.0000 mg | ORAL_TABLET | Freq: Every day | ORAL | Status: DC
  Administered 2016-10-03: 10 mg via ORAL
  Filled 2016-10-02: qty 1

## 2016-10-02 MED ORDER — CEFAZOLIN SODIUM-DEXTROSE 2-4 GM/100ML-% IV SOLN
INTRAVENOUS | Status: AC
  Filled 2016-10-02: qty 100

## 2016-10-02 MED ORDER — FENTANYL CITRATE (PF) 100 MCG/2ML IJ SOLN
INTRAMUSCULAR | Status: DC | PRN
  Administered 2016-10-02 (×2): 50 ug via INTRAVENOUS

## 2016-10-02 MED ORDER — ONDANSETRON HCL 4 MG/2ML IJ SOLN
INTRAMUSCULAR | Status: DC | PRN
  Administered 2016-10-02: 4 mg via INTRAVENOUS

## 2016-10-02 MED ORDER — LACTATED RINGERS IV SOLN
INTRAVENOUS | Status: DC
  Administered 2016-10-02 (×2): via INTRAVENOUS

## 2016-10-02 MED ORDER — MIDAZOLAM HCL 2 MG/2ML IJ SOLN
INTRAMUSCULAR | Status: AC
  Filled 2016-10-02: qty 2

## 2016-10-02 MED ORDER — MORPHINE SULFATE (PF) 4 MG/ML IV SOLN
1.0000 mg | INTRAVENOUS | Status: DC | PRN
  Administered 2016-10-02: 1 mg via INTRAVENOUS
  Filled 2016-10-02: qty 1

## 2016-10-02 MED ORDER — CHLORHEXIDINE GLUCONATE 4 % EX LIQD: 60.0000 mL | Freq: Once | CUTANEOUS | Status: DC

## 2016-10-02 MED ORDER — BISACODYL 10 MG RE SUPP: 10.0000 mg | Freq: Every day | RECTAL | Status: DC | PRN

## 2016-10-02 MED ORDER — POLYETHYLENE GLYCOL 3350 17 G PO PACK: 17.0000 g | PACK | Freq: Every day | ORAL | Status: DC | PRN

## 2016-10-02 MED ORDER — BUPIVACAINE HCL (PF) 0.25 % IJ SOLN
INTRAMUSCULAR | Status: AC
  Filled 2016-10-02: qty 30

## 2016-10-02 MED ORDER — TRANEXAMIC ACID 1000 MG/10ML IV SOLN
1000.0000 mg | Freq: Once | INTRAVENOUS | Status: AC
  Administered 2016-10-02: 19:00:00 1000 mg via INTRAVENOUS
  Filled 2016-10-02: qty 1100

## 2016-10-02 MED ORDER — DEXAMETHASONE SODIUM PHOSPHATE 10 MG/ML IJ SOLN
10.0000 mg | Freq: Once | INTRAMUSCULAR | Status: AC
  Administered 2016-10-02: 10 mg via INTRAVENOUS

## 2016-10-02 MED ORDER — ACETAMINOPHEN 325 MG PO TABS: 650.0000 mg | ORAL_TABLET | Freq: Four times a day (QID) | ORAL | Status: DC | PRN

## 2016-10-02 MED ORDER — MENTHOL 3 MG MT LOZG: 1.0000 | LOZENGE | OROMUCOSAL | Status: DC | PRN

## 2016-10-02 MED ORDER — STERILE WATER FOR IRRIGATION IR SOLN
Status: DC | PRN
  Administered 2016-10-02: 2000 mL

## 2016-10-02 MED ORDER — MIDAZOLAM HCL 5 MG/5ML IJ SOLN
INTRAMUSCULAR | Status: DC | PRN
  Administered 2016-10-02: 2 mg via INTRAVENOUS

## 2016-10-02 MED ORDER — PROPOFOL 10 MG/ML IV BOLUS
INTRAVENOUS | Status: AC
  Filled 2016-10-02: qty 20

## 2016-10-02 MED ORDER — ACETAMINOPHEN 500 MG PO TABS
1000.0000 mg | ORAL_TABLET | Freq: Four times a day (QID) | ORAL | Status: DC
Start: 2016-10-02 — End: 2016-10-03
  Administered 2016-10-02 – 2016-10-03 (×3): 1000 mg via ORAL
  Filled 2016-10-02 (×3): qty 2

## 2016-10-02 MED ORDER — ACETAMINOPHEN 10 MG/ML IV SOLN
1000.0000 mg | Freq: Once | INTRAVENOUS | Status: AC
  Administered 2016-10-02: 1000 mg via INTRAVENOUS

## 2016-10-02 MED ORDER — DIPHENHYDRAMINE HCL 12.5 MG/5ML PO ELIX: 12.5000 mg | ORAL_SOLUTION | ORAL | Status: DC | PRN

## 2016-10-02 MED ORDER — PROPOFOL 10 MG/ML IV BOLUS
INTRAVENOUS | Status: DC | PRN
  Administered 2016-10-02 (×3): 10 mg via INTRAVENOUS

## 2016-10-02 MED ORDER — CEFAZOLIN SODIUM-DEXTROSE 2-4 GM/100ML-% IV SOLN
2.0000 g | Freq: Four times a day (QID) | INTRAVENOUS | Status: AC
  Administered 2016-10-02 – 2016-10-03 (×2): 2 g via INTRAVENOUS
  Filled 2016-10-02: qty 100

## 2016-10-02 MED ORDER — DEXAMETHASONE SODIUM PHOSPHATE 10 MG/ML IJ SOLN
10.0000 mg | Freq: Once | INTRAMUSCULAR | Status: AC
  Administered 2016-10-03: 10 mg via INTRAVENOUS
  Filled 2016-10-02: qty 1

## 2016-10-02 MED ORDER — FLEET ENEMA 7-19 GM/118ML RE ENEM: 1.0000 | ENEMA | Freq: Once | RECTAL | Status: DC | PRN

## 2016-10-02 MED ORDER — BUPIVACAINE HCL (PF) 0.5 % IJ SOLN
INTRAMUSCULAR | Status: AC
  Filled 2016-10-02: qty 30

## 2016-10-02 MED ORDER — METOCLOPRAMIDE HCL 5 MG PO TABS: 5.0000 mg | ORAL_TABLET | Freq: Three times a day (TID) | ORAL | Status: DC | PRN

## 2016-10-02 MED ORDER — HYDROMORPHONE HCL 1 MG/ML IJ SOLN: 0.2500 mg | INTRAMUSCULAR | Status: DC | PRN

## 2016-10-02 MED ORDER — DOCUSATE SODIUM 100 MG PO CAPS
100.0000 mg | ORAL_CAPSULE | Freq: Two times a day (BID) | ORAL | Status: DC
  Administered 2016-10-02 – 2016-10-03 (×2): 100 mg via ORAL
  Filled 2016-10-02 (×2): qty 1

## 2016-10-02 MED ORDER — ACETAMINOPHEN 650 MG RE SUPP: 650.0000 mg | Freq: Four times a day (QID) | RECTAL | Status: DC | PRN

## 2016-10-02 MED ORDER — METOCLOPRAMIDE HCL 5 MG/ML IJ SOLN: 5.0000 mg | Freq: Three times a day (TID) | INTRAMUSCULAR | Status: DC | PRN

## 2016-10-02 MED ORDER — BUPIVACAINE HCL (PF) 0.25 % IJ SOLN
INTRAMUSCULAR | Status: DC | PRN
  Administered 2016-10-02: 30 mL

## 2016-10-02 MED ORDER — 0.9 % SODIUM CHLORIDE (POUR BTL) OPTIME
TOPICAL | Status: DC | PRN
  Administered 2016-10-02: 1000 mL

## 2016-10-02 MED ORDER — OXYCODONE HCL 5 MG PO TABS
5.0000 mg | ORAL_TABLET | ORAL | Status: DC | PRN
  Administered 2016-10-02: 10 mg via ORAL
  Administered 2016-10-02: 19:00:00 5 mg via ORAL
  Administered 2016-10-03 (×2): 10 mg via ORAL
  Administered 2016-10-03: 08:00:00 5 mg via ORAL
  Filled 2016-10-02 (×2): qty 2
  Filled 2016-10-02: qty 1
  Filled 2016-10-02: qty 2
  Filled 2016-10-02: qty 1

## 2016-10-02 MED ORDER — ACETAMINOPHEN 10 MG/ML IV SOLN
INTRAVENOUS | Status: AC
  Filled 2016-10-02: qty 100

## 2016-10-02 MED ORDER — BUPIVACAINE HCL (PF) 0.5 % IJ SOLN
INTRAMUSCULAR | Status: DC | PRN
  Administered 2016-10-02: 14 mg via INTRATHECAL

## 2016-10-02 MED ORDER — ONDANSETRON HCL 4 MG PO TABS
4.0000 mg | ORAL_TABLET | Freq: Four times a day (QID) | ORAL | Status: DC | PRN
  Administered 2016-10-03: 4 mg via ORAL
  Filled 2016-10-02: qty 1

## 2016-10-02 MED ORDER — PHENOL 1.4 % MT LIQD: 1.0000 | OROMUCOSAL | Status: DC | PRN

## 2016-10-02 SURGICAL SUPPLY — 37 items
BAG SPEC THK2 15X12 ZIP CLS (MISCELLANEOUS) ×1
BAG ZIPLOCK 12X15 (MISCELLANEOUS) ×2 IMPLANT
BLADE SAG 18X100X1.27 (BLADE) ×3 IMPLANT
CAPT HIP TOTAL 2 ×2 IMPLANT
CLOSURE WOUND 1/2 X4 (GAUZE/BANDAGES/DRESSINGS) ×2
CLOTH BEACON ORANGE TIMEOUT ST (SAFETY) ×3 IMPLANT
COVER PERINEAL POST (MISCELLANEOUS) ×3 IMPLANT
COVER SURGICAL LIGHT HANDLE (MISCELLANEOUS) ×3 IMPLANT
DECANTER SPIKE VIAL GLASS SM (MISCELLANEOUS) ×3 IMPLANT
DRAPE STERI IOBAN 125X83 (DRAPES) ×3 IMPLANT
DRAPE U-SHAPE 47X51 STRL (DRAPES) ×6 IMPLANT
DRSG ADAPTIC 3X8 NADH LF (GAUZE/BANDAGES/DRESSINGS) ×3 IMPLANT
DRSG MEPILEX BORDER 4X4 (GAUZE/BANDAGES/DRESSINGS) ×3 IMPLANT
DRSG MEPILEX BORDER 4X8 (GAUZE/BANDAGES/DRESSINGS) ×3 IMPLANT
DURAPREP 26ML APPLICATOR (WOUND CARE) ×3 IMPLANT
ELECT REM PT RETURN 15FT ADLT (MISCELLANEOUS) ×3 IMPLANT
EVACUATOR 1/8 PVC DRAIN (DRAIN) ×3 IMPLANT
GLOVE BIO SURGEON STRL SZ7.5 (GLOVE) ×3 IMPLANT
GLOVE BIO SURGEON STRL SZ8 (GLOVE) ×6 IMPLANT
GLOVE BIOGEL PI IND STRL 7.5 (GLOVE) IMPLANT
GLOVE BIOGEL PI IND STRL 8 (GLOVE) ×2 IMPLANT
GLOVE BIOGEL PI INDICATOR 7.5 (GLOVE) ×10
GLOVE BIOGEL PI INDICATOR 8 (GLOVE) ×4
GLOVE SURG SS PI 7.5 STRL IVOR (GLOVE) ×2 IMPLANT
GOWN SPEC L3 XXLG W/TWL (GOWN DISPOSABLE) ×2 IMPLANT
GOWN STRL REUS W/TWL LRG LVL3 (GOWN DISPOSABLE) ×3 IMPLANT
GOWN STRL REUS W/TWL XL LVL3 (GOWN DISPOSABLE) ×5 IMPLANT
PACK ANTERIOR HIP CUSTOM (KITS) ×3 IMPLANT
STRIP CLOSURE SKIN 1/2X4 (GAUZE/BANDAGES/DRESSINGS) ×3 IMPLANT
SUT ETHIBOND NAB CT1 #1 30IN (SUTURE) ×3 IMPLANT
SUT MNCRL AB 4-0 PS2 18 (SUTURE) ×3 IMPLANT
SUT STRATAFIX 0 PDS 27 VIOLET (SUTURE) ×3
SUT VIC AB 2-0 CT1 27 (SUTURE) ×6
SUT VIC AB 2-0 CT1 TAPERPNT 27 (SUTURE) ×2 IMPLANT
SUTURE STRATFX 0 PDS 27 VIOLET (SUTURE) ×1 IMPLANT
TRAY FOLEY CATH SILVER 14FR (SET/KITS/TRAYS/PACK) ×2 IMPLANT
YANKAUER SUCT BULB TIP 10FT TU (MISCELLANEOUS) ×3 IMPLANT

## 2016-10-02 NOTE — Anesthesia Postprocedure Evaluation (Signed)
Anesthesia Post Note  Patient: Ashley Mcclain  Procedure(s) Performed: Procedure(s) (LRB): RIGHT TOTAL HIP ARTHROPLASTY ANTERIOR APPROACH (Right)  Patient location during evaluation: PACU Anesthesia Type: Spinal Level of consciousness: oriented and awake and alert Pain management: pain level controlled Vital Signs Assessment: post-procedure vital signs reviewed and stable Respiratory status: spontaneous breathing, respiratory function stable and patient connected to nasal cannula oxygen Cardiovascular status: blood pressure returned to baseline and stable Postop Assessment: no headache and no backache Anesthetic complications: no       Last Vitals:  Vitals:   10/02/16 1626 10/02/16 1726  BP: 121/70 (!) 119/58  Pulse: 78 79  Resp: 15 15  Temp: 36.7 C 36.9 C    Last Pain:  Vitals:   10/02/16 1726  TempSrc: Axillary  PainSc:                  Dwight Burdo S

## 2016-10-02 NOTE — Op Note (Signed)
OPERATIVE REPORT- TOTAL HIP ARTHROPLASTY   PREOPERATIVE DIAGNOSIS: Osteoarthritis of the Right hip.   POSTOPERATIVE DIAGNOSIS: Osteoarthritis of the Right  hip.   PROCEDURE: Right total hip arthroplasty, anterior approach.   SURGEON: Ollen Gross, MD   ASSISTANT: Avel Peace, PA_C  ANESTHESIA:  Spinal  ESTIMATED BLOOD LOSS:-500 ml   DRAINS: Hemovac x1.   COMPLICATIONS: None   CONDITION: PACU - hemodynamically stable.   BRIEF CLINICAL NOTE: Ashley Mcclain is a 51 y.o. female who has advanced end-  stage arthritis of their Right  hip with progressively worsening pain and  dysfunction.The patient has failed nonoperative management and presents for  total hip arthroplasty.   PROCEDURE IN DETAIL: After successful administration of spinal  anesthetic, the traction boots for the [facility] bed were placed on both  feet and the patient was placed onto the [facility] bed, boots placed into the leg  holders. The Right hip was then isolated from the perineum with plastic  drapes and prepped and draped in the usual sterile fashion. ASIS and  greater trochanter were marked and a oblique incision was made, starting  at about 1 cm lateral and 2 cm distal to the ASIS and coursing towards  the anterior cortex of the femur. The skin was cut with a 10 blade  through subcutaneous tissue to the level of the fascia overlying the  tensor fascia lata muscle. The fascia was then incised in line with the  incision at the junction of the anterior third and posterior 2/3rd. The  muscle was teased off the fascia and then the interval between the TFL  and the rectus was developed. The Hohmann retractor was then placed at  the top of the femoral neck over the capsule. The vessels overlying the  capsule were cauterized and the fat on top of the capsule was removed.  A Hohmann retractor was then placed anterior underneath the rectus  femoris to give exposure to the entire anterior capsule. A T-shaped   capsulotomy was performed. The edges were tagged and the femoral head  was identified.       Osteophytes are removed off the superior acetabulum.  The femoral neck was then cut in situ with an oscillating saw. Traction  was then applied to the left lower extremity utilizing the [facility]  traction. The femoral head was then removed. Retractors were placed  around the acetabulum and then circumferential removal of the labrum was  performed. Osteophytes were also removed. Reaming starts at 43 mm to  medialize and  Increased in 2 mm increments to 47 mm. We reamed in  approximately 40 degrees of abduction, 20 degrees anteversion. A 48 mm  pinnacle acetabular shell was then impacted in anatomic position under  fluoroscopic guidance with excellent purchase. We did not need to place  any additional dome screws. A 28 mm neutral + 4 marathon liner was then  placed into the acetabular shell.       The femoral lift was then placed along the lateral aspect of the femur  just distal to the vastus ridge. The leg was  externally rotated and capsule  was stripped off the inferior aspect of the femoral neck down to the  level of the lesser trochanter, this was done with electrocautery. The femur was lifted after this was performed. The  leg was then placed in an extended and adducted position essentially delivering the femur. We also removed the capsule superiorly and the piriformis from the piriformis fossa  to gain excellent exposure of the  proximal femur. Rongeur was used to remove some cancellous bone to get  into the lateral portion of the proximal femur for placement of the  initial starter reamer. The starter broaches was placed  the starter broach  and was shown to go down the center of the canal. Broaching  with the  Corail system was then performed starting at size 8, coursing  Up to size 9. A size 9 had excellent torsional and rotational  and axial stability. The trial standard offset neck was then  placed  with a 28 + 1.5 trial head. The hip was then reduced. We confirmed that  the stem was in the canal both on AP and lateral x-rays. It also has excellent sizing. The hip was reduced with outstanding stability through full extension and full external rotation.. AP pelvis was taken and the leg lengths were measured and found to be equal. Hip was then dislocated again and the femoral head and neck removed. The  femoral broach was removed. Size 9 Corail stem with a standard offset  neck was then impacted into the femur following native anteversion. Has  excellent purchase in the canal. Excellent torsional and rotational and  axial stability. It is confirmed to be in the canal on AP and lateral  fluoroscopic views. The 28 + 1.5 ceramic head was placed and the hip  reduced with outstanding stability. Again AP pelvis was taken and it  confirmed that the leg lengths were equal. The wound was then copiously  irrigated with saline solution and the capsule reattached and repaired  with Ethibond suture. 30 ml of .25% Bupivicaine was  injected into the capsule and into the edge of the tensor fascia lata as well as subcutaneous tissue. The fascia overlying the tensor fascia lata was then closed with a running #1 V-Loc. Subcu was closed with interrupted 2-0 Vicryl and subcuticular running 4-0 Monocryl. Incision was cleaned  and dried. Steri-Strips and a bulky sterile dressing applied. Hemovac  drain was hooked to suction and then the patient was awakened and transported to  recovery in stable condition.        Please note that a surgical assistant was a medical necessity for this procedure to perform it in a safe and expeditious manner. Assistant was necessary to provide appropriate retraction of vital neurovascular structures and to prevent femoral fracture and allow for anatomic placement of the prosthesis.  Ollen Gross, M.D.

## 2016-10-02 NOTE — Interval H&P Note (Signed)
History and Physical Interval Note:  10/02/2016 12:29 PM  Ashley Mcclain  has presented today for surgery, with the diagnosis of OA Right hip  The various methods of treatment have been discussed with the patient and family. After consideration of risks, benefits and other options for treatment, the patient has consented to  Procedure(s): RIGHT TOTAL HIP ARTHROPLASTY ANTERIOR APPROACH (Right) as a surgical intervention .  The patient's history has been reviewed, patient examined, no change in status, stable for surgery.  I have reviewed the patient's chart and labs.  Questions were answered to the patient's satisfaction.     Loanne Drilling

## 2016-10-02 NOTE — H&P (View-Only) (Signed)
Ashley Mcclain DOB: 1965-08-23 Divorced / Language: [place] / Race: White Female Date of Admission:  10/02/2016 CC:  Right Hip Pain History of Present Illness   The patient is a 51 year old female who comes in  for a preoperative History and Physical. The patient is scheduled for a right total hip arthroplasty (anterior) to be performed by Dr. Gus Rankin. Aluisio, MD at [facility] on 10-02-2016. The patient is a 51 year old female who presented for follow up of their hip. The patient is being followed for their right hip pain and osteoarthritis. They are year(s) out from when symptoms began. Symptoms reported include: pain, pain at night, aching (weakness when doing squats at the gym), stiffness, pain with weightbearing and difficulty ambulating. The patient feels that they are doing poorly and report their pain level to be moderate. Current treatment includes: relative rest. The following medication has been used for pain control: antiinflammatory medication (ibuprofen prn). The patient has not gotten any relief of their symptoms with Cortisone injections (IA injection: did not help). Unfortunately, her right hip has gotten progressively worse. It is now bothering her with all activities. It is limiting what she can and cannot do. She would like to be much more active, but the hip is preventing her from doing so. Pain is in her groin and anterior thigh. She is not having any back pain with this. Left hip currently is not hurting. She is ready to get the hip fixed. They have been treated conservatively in the past for the above stated problem and despite conservative measures, they continue to have progressive pain and severe functional limitations and dysfunction. They have failed non-operative management including home exercise, medications, and injections. It is felt that they would benefit from undergoing total joint replacement. Risks and benefits of the procedure have been discussed with the  patient and they elect to proceed with surgery. There are no active contraindications to surgery such as ongoing infection or rapidly progressive neurological disease.  Problem List/Past Medical Parasthesia/Numbness (782.0)  Arthralgia of right hip (M25.551)  Primary osteoarthritis of right hip (M16.11)  Anemia  Hemorrhoids  Cystitis   Allergies No Known Drug Allergies   Family History  Rheumatoid Arthritis  Father. father Hypertension  First Degree Relatives. brother  Social History  Current occupation  Best boy Marital status  divorced Living situation  live alone Illicit drug use  no Pain Contract  no Number of flights of stairs before winded  greater than 5 Current work status  working full time Children  2 Alcohol use  former drinker Exercise  Exercises daily; does running / walking, other, gym / [employer] and team sport Drug/Alcohol Rehab (Previously)  no Drug/Alcohol Rehab (Currently)  no Tobacco use  Never smoker. never smoker Post-Surgical Plans  Home with family  Medication History Ibuprofen (  Capsule, 1 (one) Oral) Active. Tramadol Active. Turmeric Active. Multivitamin Active.  Past Surgical History Tonsillectomy   Review of Systems  General Not Present- Chills, Fatigue, Fever, Memory Loss, Night Sweats, Weight Gain and Weight Loss. Skin Not Present- Eczema, Hives, Itching, Lesions and Rash. HEENT Not Present- Dentures, Double Vision, Headache, Hearing Loss, Tinnitus and Visual Loss. Respiratory Not Present- Allergies, Chronic Cough, Coughing up blood, Shortness of breath at rest and Shortness of breath with exertion. Cardiovascular Not Present- Chest Pain, Difficulty Breathing Lying Down, Murmur, Palpitations, Racing/skipping heartbeats and Swelling. Gastrointestinal Present- Diarrhea. Not Present- Abdominal Pain, Bloody Stool, Constipation, DifDMYA LONGwallowing, Heartburn, Jaundice, Loss of appetitie, Nausea  and  Vomiting. Female Genitourinary Not Present- Blood in Urine, Discharge, Flank Pain, Incontinence, Painful Urination, Urgency, Urinary frequency, Urinary Retention, Urinating at Night and Weak urinary stream. Musculoskeletal Present- Joint Pain. Not Present- Back Pain, Joint Swelling, Morning Stiffness, Muscle Pain, Muscle Weakness and Spasms. Neurological Not Present- Blackout spells, Difficulty with balance, Dizziness, Paralysis, Tremor and Weakness. Psychiatric Not Present- Insomnia.  Vitals  Weight: 140 lb Height: 64in Weight was reported by patient. Height was reported by patient. Body Surface Area: 1.68 m Body Mass Index: 24.03 kg/m  Pulse: 68 (Regular)  BP: 118/64 (Sitting, Right Arm, Standard)  Physical Exam  General Mental Status -Alert, cooperative and good historian. General Appearance-pleasant, Not in acute distress. Orientation-Oriented X3. Build & Nutrition-Well nourished and Well developed.  Head and Neck Head-normocephalic, atraumatic . Neck Global Assessment - supple, no bruit auscultated on the right, no bruit auscultated on the left.  Eye Pupil - Bilateral-Regular and Round. Motion - Bilateral-EOMI.  Chest and Lung Exam Auscultation Breath sounds - clear at anterior chest wall and clear at posterior chest wall. Adventitious sounds - No Adventitious sounds.  Cardiovascular Auscultation Rhythm - Regular rate and rhythm. Heart Sounds - S1 WNL and S2 WNL. Murmurs & Other Heart Sounds - Auscultation of the heart reveals - No Murmurs.  Abdomen Palpation/Percussion Tenderness - Abdomen is non-tender to palpation. Rigidity (guarding) - Abdomen is soft. Auscultation Auscultation of the abdomen reveals - Bowel sounds normal.  Female Genitourinary Note: Not done, not pertinent to present illness   Musculoskeletal Note: Well developed female in no distress. Evaluation of her left hip, normal range of motion, no discomfort. Right hip can  be flexed to about 100, no internal rotation, about 20 external rotation, 20 abduction. She has a significantly antalgic gait pattern on the right. Pulse, sensation, motor intact distally.  RADIOGRAPHS AP pelvis and lateral of the right hip show bone on bone change with subchondral cystic formation.   Assessment & Plan Primary osteoarthritis of right hip (M16.11)  Note:Surgical Plans: Right Total Hip Replacement - Anterior Approach  Disposition: Home with family  PCP: Dr. Juluis Rainier - pending at time of H&P  IV TXA  Anesthesia Issues: None  Patient was instructed on what medications to stop prior to surgery.  Signed electronically by Lauraine Rinne, III PA-C

## 2016-10-02 NOTE — Anesthesia Preprocedure Evaluation (Signed)
Anesthesia Evaluation  Patient identified by MRN, date of birth, ID band Patient awake    Reviewed: Allergy & Precautions, H&P , NPO status , Patient's Chart, lab work & pertinent test results  Airway Mallampati: II   Neck ROM: full    Dental   Pulmonary neg pulmonary ROS,    breath sounds clear to auscultation       Cardiovascular negative cardio ROS   Rhythm:regular Rate:Normal     Neuro/Psych Numbness in left leg  Neuromuscular disease    GI/Hepatic   Endo/Other    Renal/GU      Musculoskeletal  (+) Arthritis , Osteoarthritis,    Abdominal   Peds  Hematology   Anesthesia Other Findings   Reproductive/Obstetrics                             Anesthesia Physical Anesthesia Plan  ASA: II  Anesthesia Plan: Spinal   Post-op Pain Management:    Induction: Intravenous  Airway Management Planned: Simple Face Mask  Additional Equipment:   Intra-op Plan:   Post-operative Plan:   Informed Consent: I have reviewed the patients History and Physical, chart, labs and discussed the procedure including the risks, benefits and alternatives for the proposed anesthesia with the patient or authorized representative who has indicated his/her understanding and acceptance.     Plan Discussed with: CRNA, Anesthesiologist and Surgeon  Anesthesia Plan Comments:         Anesthesia Quick Evaluation

## 2016-10-02 NOTE — Transfer of Care (Signed)
Immediate Anesthesia Transfer of Care Note  Patient: Ashley Mcclain  Procedure(s) Performed: Procedure(s): RIGHT TOTAL HIP ARTHROPLASTY ANTERIOR APPROACH (Right)  Patient Location: PACU  Anesthesia Type:Spinal  Level of Consciousness: awake, alert  and oriented  Airway & Oxygen Therapy: Patient Spontanous Breathing and Patient connected to face mask oxygen  Post-op Assessment: Report given to RN and Post -op Vital signs reviewed and stable  Post vital signs: Reviewed and stable  Last Vitals:  Vitals:   10/02/16 1159  BP: 115/70  Pulse: 73  Resp: 16  Temp: 36.6 C    Last Pain:  Vitals:   10/02/16 1201  TempSrc:   PainSc: 3       Patients Stated Pain Goal: 4 (10/02/16 1201)  Complications: No apparent anesthesia complications

## 2016-10-02 NOTE — Anesthesia Procedure Notes (Signed)
Spinal  Patient location during procedure: OR Start time: 10/02/2016 12:40 PM End time: 10/02/2016 12:44 PM Staffing Anesthesiologist: Chaney Malling, Kyel Purk Performed: anesthesiologist  Preanesthetic Checklist Completed: patient identified, surgical consent, pre-op evaluation, timeout performed, IV checked, risks and benefits discussed and monitors and equipment checked Spinal Block Patient position: sitting Prep: DuraPrep Patient monitoring: cardiac monitor, continuous pulse ox and blood pressure Approach: midline Injection technique: single-shot Needle Needle type: Pencan  Needle gauge: 24 G Needle length: 9 cm Assessment Sensory level: T10 Additional Notes Functioning IV was confirmed and monitors were applied. Sterile prep and drape, including hand hygiene and sterile gloves were used. The patient was positioned and the spine was prepped. The skin was anesthetized with lidocaine.  Free flow of clear CSF was obtained prior to injecting local anesthetic into the CSF.  The spinal needle aspirated freely following injection.  The needle was carefully withdrawn.  The patient tolerated the procedure well.

## 2016-10-03 ENCOUNTER — Encounter (HOSPITAL_COMMUNITY): Payer: Self-pay | Admitting: Orthopedic Surgery

## 2016-10-03 LAB — CBC
HCT: 28.9 % — ABNORMAL LOW (ref 36.0–46.0)
Hemoglobin: 9.8 g/dL — ABNORMAL LOW (ref 12.0–15.0)
MCH: 29.7 pg (ref 26.0–34.0)
MCHC: 33.9 g/dL (ref 30.0–36.0)
MCV: 87.6 fL (ref 78.0–100.0)
PLATELETS: 222 10*3/uL (ref 150–400)
RBC: 3.3 MIL/uL — ABNORMAL LOW (ref 3.87–5.11)
RDW: 12 % (ref 11.5–15.5)
WBC: 14.1 10*3/uL — ABNORMAL HIGH (ref 4.0–10.5)

## 2016-10-03 LAB — BASIC METABOLIC PANEL
Anion gap: 7 (ref 5–15)
BUN: 8 mg/dL (ref 6–20)
CALCIUM: 8.1 mg/dL — AB (ref 8.9–10.3)
CO2: 24 mmol/L (ref 22–32)
CREATININE: 0.68 mg/dL (ref 0.44–1.00)
Chloride: 105 mmol/L (ref 101–111)
GFR calc Af Amer: >60 mL/min (ref >60)
Glucose, Bld: 159 mg/dL — ABNORMAL HIGH (ref 65–99)
POTASSIUM: 4 mmol/L (ref 3.5–5.1)
SODIUM: 136 mmol/L (ref 135–145)

## 2016-10-03 MED ORDER — SODIUM CHLORIDE 0.9 % IV BOLUS (SEPSIS)
500.0000 mL | Freq: Once | INTRAVENOUS | Status: AC
  Administered 2016-10-03: 500 mL via INTRAVENOUS

## 2016-10-03 MED ORDER — METHOCARBAMOL 500 MG PO TABS: 500.0000 mg | ORAL_TABLET | Freq: Four times a day (QID) | ORAL | 0 refills | Status: DC | PRN

## 2016-10-03 MED ORDER — TRAMADOL HCL 50 MG PO TABS: 50.0000 mg | ORAL_TABLET | Freq: Four times a day (QID) | ORAL | 0 refills | Status: DC | PRN

## 2016-10-03 MED ORDER — POLYSACCHARIDE IRON COMPLEX 150 MG PO CAPS
150.0000 mg | ORAL_CAPSULE | Freq: Every day | ORAL | Status: DC
  Administered 2016-10-03: 09:00:00 150 mg via ORAL
  Filled 2016-10-03: qty 1

## 2016-10-03 MED ORDER — OXYCODONE HCL 5 MG PO TABS: 5.0000 mg | ORAL_TABLET | ORAL | 0 refills | Status: DC | PRN

## 2016-10-03 MED ORDER — POLYSACCHARIDE IRON COMPLEX 150 MG PO CAPS: 150.0000 mg | ORAL_CAPSULE | Freq: Every day | ORAL | 0 refills | Status: DC

## 2016-10-03 MED ORDER — RIVAROXABAN 10 MG PO TABS: 10.0000 mg | ORAL_TABLET | Freq: Every day | ORAL | 0 refills | Status: DC

## 2016-10-03 NOTE — Progress Notes (Signed)
Physical Therapy Treatment Patient Details Name: Ashley Mcclain MRN: [id number] DOB: [birth year] Today's Date: 10/03/2016    History of Present Illness Pt s/p R THR    PT Comments    Pt motivated and progressing steadily with mobility despite ongoing nausea.  Reviewed therex and car transfers.   Follow Up Recommendations  Home health PT     Equipment Recommendations  None recommended by PT    Recommendations for Other Services OT consult     Precautions / Restrictions Precautions Precautions: Fall Precaution Comments: mild nausea with ambulation Restrictions Weight Bearing Restrictions: No    Mobility  Bed Mobility Overal bed mobility: Needs Assistance Bed Mobility: Supine to Sit     Supine to sit: Min assist     General bed mobility comments: NT - pt up in chair and requests back to same  Transfers Overall transfer level: Needs assistance Equipment used: Rolling walker (2 wheeled) Transfers: Sit to/from Stand Sit to Stand: Supervision         General transfer comment: min cues for LE management and use of UEs to self assist  Ambulation/Gait Ambulation/Gait assistance: Min guard;Supervision Ambulation Distance (Feet): 150 Feet (and 20' back from bathroom) Assistive device: Rolling walker (2 wheeled) Gait Pattern/deviations: Step-to pattern;Step-through pattern;Decreased step length - right;Decreased step length - left;Shuffle;Trunk flexed Gait velocity: decr Gait velocity interpretation: Below normal speed for age/gender General Gait Details: cues for sequence, posture and position from RW - distance ltd by onset dizziness/nausea - BP 121/55   Stairs            Wheelchair Mobility    Modified Rankin (Stroke Patients Only)       Balance Overall balance assessment: No apparent balance deficits (not formally assessed)                                          Cognition Arousal/Alertness: Awake/alert Behavior During Therapy:  WFL for tasks assessed/performed Overall Cognitive Status: Within Functional Limits for tasks assessed                                        Exercises Total Joint Exercises Ankle Circles/Pumps: AROM;Both;20 reps;Supine Quad Sets: AROM;Both;10 reps;Supine Heel Slides: AAROM;Right;20 reps;Supine Hip ABduction/ADduction: AAROM;Right;15 reps;Supine    General Comments        Pertinent Vitals/Pain Pain Assessment: 0-10 Pain Score: 3  Pain Location: R hip Pain Descriptors / Indicators: Aching;Sore Pain Intervention(s): Limited activity within patient's tolerance;Monitored during session;Premedicated before session;Ice applied    Home Living Family/patient expects to be discharged to:: Private residence Living Arrangements: Parent Available Help at Discharge: Family;Available PRN/intermittently Type of Home: House Home Access: Level entry   Home Layout: Two level;Able to live on main level with bedroom/bathroom Home Equipment: Dan Humphreys - 2 wheels;Crutches;Bedside commode      Prior Function Level of Independence: Independent          PT Goals (current goals can now be found in the care plan section) Acute Rehab PT Goals Patient Stated Goal: Regain IND and resume active lifestyle PT Goal Formulation: With patient Time For Goal Achievement: 10/05/16 Potential to Achieve Goals: Good Progress towards PT goals: Progressing toward goals    Frequency    7X/week      PT Plan Current plan remains appropriate  Co-evaluation             End of Session Equipment Utilized During Treatment: Gait belt Activity Tolerance: Patient limited by fatigue;Other (comment) (mild nausea) Patient left: in chair;with call bell/phone within reach;with chair alarm set Nurse Communication: Mobility status;Other (comment) PT Visit Diagnosis: Unsteadiness on feet (R26.81);Difficulty in walking, not elsewhere classified (R26.2)     Time: 1610-9604 PT Time Calculation  (min) (ACUTE ONLY): 23 min  Charges:  $Gait Training: 8-22 mins $Therapeutic Exercise: 8-22 mins $Therapeutic Activity: 8-22 mins                    G Codes:       Pg [phone]    Kaeleigh Westendorf 10/03/2016, 2:51 PM

## 2016-10-03 NOTE — Discharge Summary (Signed)
Physician Discharge Summary   Patient ID: Ashley Mcclain MRN: [id number] DOB/AGE: 11-12-1965 51 y.o.  Admit date: 10/02/2016 Discharge date: 10-03-16  Primary Diagnosis:  Osteoarthritis of the Right hip.  Admission Diagnoses:  Past Medical History:  Diagnosis Date  . Anemia    Mild and was told she had low B12 in the past  . Environmental exposure    To grass  . Numbness in left leg 03/26/2013   Discharge Diagnoses:   Principal Problem:   OA (osteoarthritis) of hip  Estimated body mass index is 25.97 kg/m as calculated from the following:   Height as of this encounter: _0  (1.575 m).   Weight as of this encounter: 64.4 kg (142 lb).  Procedure(s) (LRB): RIGHT TOTAL HIP ARTHROPLASTY ANTERIOR APPROACH (Right)   Consults: None  HPI: Ashley Mcclain is a 51 y.o. female who has advanced end-  stage arthritis of their Right  hip with progressively worsening pain and  dysfunction.The patient has failed nonoperative management and presents for  total hip arthroplasty.  Laboratory Data: Admission on 10/02/2016  Component Date Value Ref Range Status  . WBC 10/03/2016 14.1* 4.0 - 10.5 K/uL Final  . RBC 10/03/2016 3.30* 3.87 - 5.11 MIL/uL Final  . Hemoglobin 10/03/2016 9.8* 12.0 - 15.0 g/dL Final  . HCT 10/03/2016 28.9* 36.0 - 46.0 % Final  . MCV 10/03/2016 87.6  78.0 - 100.0 fL Final  . MCH 10/03/2016 29.7  26.0 - 34.0 pg Final  . MCHC 10/03/2016 33.9  30.0 - 36.0 g/dL Final  . RDW 10/03/2016 12.0  11.5 - 15.5 % Final  . Platelets 10/03/2016 222  150 - 400 K/uL Final  . Sodium 10/03/2016 136  135 - 145 mmol/L Final  . Potassium 10/03/2016 4.0  3.5 - 5.1 mmol/L Final  . Chloride 10/03/2016 105  101 - 111 mmol/L Final  . CO2 10/03/2016 24  22 - 32 mmol/L Final  . Glucose, Bld 10/03/2016 159* 65 - 99 mg/dL Final  . BUN 10/03/2016 8  6 - 20 mg/dL Final  . Creatinine, Ser 10/03/2016 0.68  0.44 - 1.00 mg/dL Final  . Calcium 10/03/2016 8.1* 8.9 - 10.3 mg/dL Final  . GFR calc non Af  Amer 10/03/2016 >60  >60 mL/min Final  . GFR calc Af Amer 10/03/2016 >60  >60 mL/min Final   Comment: (NOTE) The eGFR has been calculated using the CKD EPI equation. This calculation has not been validated in all clinical situations. eGFR's persistently <60 mL/min signify possible Chronic Kidney Disease.   Georgiann Hahn gap 10/03/2016 7  5 - 15 Final  Hospital Outpatient Visit on 09/25/2016  Component Date Value Ref Range Status  . Preg, Serum 09/25/2016 NEGATIVE  NEGATIVE Final   Comment:        THE SENSITIVITY OF THIS METHODOLOGY IS >10 mIU/mL.   Marland Kitchen aPTT 09/25/2016 28  24 - 36 seconds Final  . WBC 09/25/2016 5.3  4.0 - 10.5 K/uL Final  . RBC 09/25/2016 4.25  3.87 - 5.11 MIL/uL Final  . Hemoglobin 09/25/2016 13.2  12.0 - 15.0 g/dL Final  . HCT 09/25/2016 39.0  36.0 - 46.0 % Final  . MCV 09/25/2016 91.8  78.0 - 100.0 fL Final  . MCH 09/25/2016 31.1  26.0 - 34.0 pg Final  . MCHC 09/25/2016 33.8  30.0 - 36.0 g/dL Final  . RDW 09/25/2016 12.5  11.5 - 15.5 % Final  . Platelets 09/25/2016 252  150 - 400 K/uL Final  . Sodium  09/25/2016 139  135 - 145 mmol/L Final  . Potassium 09/25/2016 4.2  3.5 - 5.1 mmol/L Final  . Chloride 09/25/2016 105  101 - 111 mmol/L Final  . CO2 09/25/2016 27  22 - 32 mmol/L Final  . Glucose, Bld 09/25/2016 92  65 - 99 mg/dL Final  . BUN 09/25/2016 14  6 - 20 mg/dL Final  . Creatinine, Ser 09/25/2016 0.74  0.44 - 1.00 mg/dL Final  . Calcium 09/25/2016 9.1  8.9 - 10.3 mg/dL Final  . Total Protein 09/25/2016 7.2  6.5 - 8.1 g/dL Final  . Albumin 09/25/2016 4.4  3.5 - 5.0 g/dL Final  . AST 09/25/2016 19  15 - 41 U/L Final  . ALT 09/25/2016 14  14 - 54 U/L Final  . Alkaline Phosphatase 09/25/2016 43  38 - 126 U/L Final  . Total Bilirubin 09/25/2016 0.9  0.3 - 1.2 mg/dL Final  . GFR calc non Af Amer 09/25/2016 >60  >60 mL/min Final  . GFR calc Af Amer 09/25/2016 >60  >60 mL/min Final   Comment: (NOTE) The eGFR has been calculated using the CKD EPI equation. This  calculation has not been validated in all clinical situations. eGFR's persistently <60 mL/min signify possible Chronic Kidney Disease.   . Anion gap 09/25/2016 7  5 - 15 Final  . Prothrombin Time 09/25/2016 12.6  11.4 - 15.2 seconds Final  . INR 09/25/2016 0.94   Final  . ABO/RH(D) 09/25/2016 A NEG   Final  . Antibody Screen 09/25/2016 NEG   Final  . Sample Expiration 09/25/2016 10/05/2016   Final  . Extend sample reason 09/25/2016 NO TRANSFUSIONS OR PREGNANCY IN THE PAST 3 MONTHS   Final  . MRSA, PCR 09/25/2016 NEGATIVE  NEGATIVE Final  . Staphylococcus aureus 09/25/2016 NEGATIVE  NEGATIVE Final   Comment:        The Xpert SA Assay (FDA approved for NASAL specimens in patients over 42 years of age), is one component of a comprehensive surveillance program.  Test performance has been validated by [facility] for patients greater than or equal to 73 year old. It is not intended to diagnose infection nor to guide or monitor treatment.   . ABO/RH(D) 09/25/2016 A NEG   Final     X-Rays:Dg Pelvis Portable  Result Date: 10/02/2016 CLINICAL DATA:  Status post hip replacement EXAM: PORTABLE PELVIS 1-2 VIEWS COMPARISON:  None. FINDINGS: Total right hip arthroplasty with surgical drain and soft tissue gas. The prosthesis appears located and there is no periprosthetic fracture visible. IMPRESSION: Total right hip arthroplasty without unexpected finding. Electronically Signed   By: Monte Fantasia M.D.   On: 10/02/2016 14:42   Dg C-arm 1-60 Min-no Report  Result Date: 10/02/2016 Fluoroscopy was utilized by the requesting physician.  No radiographic interpretation.    EKG:No orders found for this or any previous visit.   Hospital Course: Patient was admitted to [facility] and taken to the OR and underwent the above state procedure without complications.  Patient tolerated the procedure well and was later transferred to the recovery room and then to the orthopaedic floor for  postoperative care.  They were given PO and IV analgesics for pain control following their surgery.  They were given 24 hours of postoperative antibiotics of  Anti-infectives    Start     Dose/Rate Route Frequency Ordered Stop   10/02/16 1900  ceFAZolin (ANCEF) IVPB 2g/100 mL premix     2 g 200 mL/hr over 30 Minutes  Intravenous Every 6 hours 10/02/16 1533 10/03/16 0026   10/02/16 1154  ceFAZolin (ANCEF) 2-4 GM/100ML-% IVPB    Comments:  [place] Session   : cabinet override      10/02/16 1154 10/02/16 1240   10/02/16 1149  ceFAZolin (ANCEF) IVPB 2g/100 mL premix     2 g 200 mL/hr over 30 Minutes Intravenous On call to O.R. 10/02/16 1149 10/02/16 1250     and started on DVT prophylaxis in the form of Xarelto.   PT and OT were ordered for total hip protocol.  The patient was allowed to be WBAT with therapy. Discharge planning was consulted to help with postop disposition and equipment needs.  Patient had a good night on the evening of surgery.  They started to get up OOB with therapy on day one.  Walked twice with therapy.  Hemovac drain was pulled without difficulty.   Patient was seen in rounds on POD 1 and was ready to go home later that same day.  Discharge home with home health Diet - Regular diet Follow up - in 2 weeks Activity - WBAT Disposition - Home Condition Upon Discharge - good D/C Meds - See DC Summary DVT Prophylaxis - Xarelto   Discharge Instructions    Call MD / Call 911    Complete by:  As directed    If you experience chest pain or shortness of breath, CALL 911 and be transported to the hospital emergency room.  If you develope a fever above 101 F, pus (white drainage) or increased drainage or redness at the wound, or calf pain, call your surgeon's office.   Change dressing    Complete by:  As directed    You may change your dressing dressing daily with sterile 4 x 4 inch gauze dressing and paper tape.  Do not submerge the incision under water.   Constipation  Prevention    Complete by:  As directed    Drink plenty of fluids.  Prune juice may be helpful.  You may use a stool softener, such as Colace (over the counter) 100 mg twice a day.  Use MiraLax (over the counter) for constipation as needed.   Diet general    Complete by:  As directed    Discharge instructions    Complete by:  As directed    Pick up stool softner and laxative for home use following surgery while on pain medications. Do not submerge incision under water. Please use good hand washing techniques while changing dressing each day. May shower starting three days after surgery. Please use a clean towel to pat the incision dry following showers. Continue to use ice for pain and swelling after surgery. Do not use any lotions or creams on the incision until instructed by your surgeon.  Wear both TED hose on both legs during the day every day for three weeks, but may have off at night at home.  Postoperative Constipation Protocol  Constipation - defined medically as fewer than three stools per week and severe constipation as less than one stool per week.  One of the most common issues patients have following surgery is constipation.  Even if you have a regular bowel pattern at home, your normal regimen is likely to be disrupted due to multiple reasons following surgery.  Combination of anesthesia, postoperative narcotics, change in appetite and fluid intake all can affect your bowels.  In order to avoid complications following surgery, here are some recommendations in order to help you during  your recovery period.  Colace (docusate) - Pick up an over-the-counter form of Colace or another stool softener and take twice a day as long as you are requiring postoperative pain medications.  Take with a full glass of water daily.  If you experience loose stools or diarrhea, hold the colace until you stool forms back up.  If your symptoms do not get better within 1 week or if they get worse, check  with your doctor.  Dulcolax (bisacodyl) - Pick up over-the-counter and take as directed by the product packaging as needed to assist with the movement of your bowels.  Take with a full glass of water.  Use this product as needed if not relieved by Colace only.   MiraLax (polyethylene glycol) - Pick up over-the-counter to have on hand.  MiraLax is a solution that will increase the amount of water in your bowels to assist with bowel movements.  Take as directed and can mix with a glass of water, juice, soda, coffee, or tea.  Take if you go more than two days without a movement. Do not use MiraLax more than once per day. Call your doctor if you are still constipated or irregular after using this medication for 7 days in a row.  If you continue to have problems with postoperative constipation, please contact the office for further assistance and recommendations.  If you experience "the worst abdominal pain ever" or develop nausea or vomiting, please contact the office immediatly for further recommendations for treatment.   Take Xarelto for two and a half more weeks, then discontinue Xarelto. Once the patient has completed the blood thinner regimen, then take a Baby 81 mg Aspirin daily for three more weeks.   Do not sit on low chairs, stoools or toilet seats, as it may be difficult to get up from low surfaces    Complete by:  As directed    Driving restrictions    Complete by:  As directed    No driving until released by the physician.   Increase activity slowly as tolerated    Complete by:  As directed    Lifting restrictions    Complete by:  As directed    No lifting until released by the physician.   Patient may shower    Complete by:  As directed    You may shower without a dressing once there is no drainage.  Do not wash over the wound.  If drainage remains, do not shower until drainage stops.   TED hose    Complete by:  As directed    Use stockings (TED hose) for 3 weeks on both leg(s).   You may remove them at night for sleeping.   Weight bearing as tolerated    Complete by:  As directed    Laterality:  right   Extremity:  Lower     Allergies as of 10/03/2016      Reactions   Gluten Meal    Gi distress       Medication List    STOP taking these medications   calcium carbonate 500 MG chewable tablet Commonly known as:  TUMS - dosed in mg elemental calcium   levonorgestrel 20 MCG/24HR IUD Commonly known as:  MIRENA   naproxen sodium 220 MG tablet Commonly known as:  ANAPROX   TURMERIC PO     TAKE these medications   iron polysaccharides 150 MG capsule Commonly known as:  NIFEREX Take 1 capsule (150 mg total) by mouth  daily.   methocarbamol 500 MG tablet Commonly known as:  ROBAXIN Take 1 tablet (500 mg total) by mouth every 6 (six) hours as needed for muscle spasms.   oxyCODONE 5 MG immediate release tablet Commonly known as:  Oxy IR/ROXICODONE Take 1-2 tablets (5-10 mg total) by mouth every 4 (four) hours as needed for moderate pain or severe pain.   rivaroxaban 10 MG Tabs tablet Commonly known as:  XARELTO Take 1 tablet (10 mg total) by mouth daily with breakfast. Take Xarelto for two and a half more weeks following discharge from the hospital, then discontinue Xarelto. Once the patient has completed the blood thinner regimen, then take a Baby 81 mg Aspirin daily for three more weeks. Start taking on:  10/04/2016   traMADol 50 MG tablet Commonly known as:  ULTRAM Take 1-2 tablets (50-100 mg total) by mouth every 6 (six) hours as needed for moderate pain. What changed:  See the new instructions.      Follow-up Information    Gearlean Alf, MD. Schedule an appointment as soon as possible for a visit on 10/15/2016.   Specialty:  Orthopedic Surgery Contact information: [address] [place] [address] [phone]           Signed: Arlee Muslim, PA-C Orthopaedic Surgery 10/03/2016, 9:07 AM

## 2016-10-03 NOTE — Progress Notes (Signed)
   Subjective: 1 Day Post-Op Procedure(s) (LRB): RIGHT TOTAL HIP ARTHROPLASTY ANTERIOR APPROACH (Right) Patient reports pain as mild.   Patient seen in rounds with Dr. Lequita Halt. Patient is well, but has had some minor complaints of pain in the hip, requiring pain medications We will start therapy today.  If they do well with therapy and meets all goals, then will allow home later this afternoon following therapy. Plan is to go Home after hospital stay.  Objective: Vital signs in last 24 hours: Temp:  [97.5 F (36.4 C)-99.1 F (37.3 C)] 99.1 F (37.3 C) (04/26 0456) Pulse Rate:  [55-88] 74 (04/26 0456) Resp:  [10-16] 15 (04/26 0456) BP: (95-122)/(48-70) 100/48 (04/26 0456) SpO2:  [99 %-100 %] 100 % (04/26 0456) Weight:  [64.4 kg (142 lb)] 64.4 kg (142 lb) (04/25 1201)  Intake/Output from previous day:  Intake/Output Summary (Last 24 hours) at 10/03/16 0855 Last data filed at 10/03/16 0546  Gross per 24 hour  Intake             3520 ml  Output             2335 ml  Net             1185 ml    Intake/Output this shift: No intake/output data recorded.  Labs:  Recent Labs  10/03/16 0508  HGB 9.8*    Recent Labs  10/03/16 0508  WBC 14.1*  RBC 3.30*  HCT 28.9*  PLT 222    Recent Labs  10/03/16 0508  NA 136  K 4.0  CL 105  CO2 24  BUN 8  CREATININE 0.68  GLUCOSE 159*  CALCIUM 8.1*   No results for input(s): LABPT, INR in the last 72 hours.  EXAM General - Patient is Alert, Appropriate and Oriented Extremity - Neurovascular intact Sensation intact distally Intact pulses distally Dressing - dressing C/D/I Motor Function - intact, moving foot and toes well on exam.  Hemovac pulled without difficulty.  Past Medical History:  Diagnosis Date  . Anemia    Mild and was told she had low B12 in the past  . Environmental exposure    To grass  . Numbness in left leg 03/26/2013    Assessment/Plan: 1 Day Post-Op Procedure(s) (LRB): RIGHT TOTAL HIP  ARTHROPLASTY ANTERIOR APPROACH (Right) Principal Problem:   OA (osteoarthritis) of hip  Estimated body mass index is 25.97 kg/m as calculated from the following:   Height as of this encounter:  (1.575 m).   Weight as of this encounter: 64.4 kg (142 lb). Advance diet Up with therapy Discharge home with home health after meets goals  DVT Prophylaxis - Xarelto Weight Bearing As Tolerated right Leg Hemovac Pulled Begin Therapy  If meets goals and able to go home: Up with therapy Discharge home with home health Diet - Regular diet Follow up - in 2 weeks Activity - WBAT Disposition - Home Condition Upon Discharge - pending D/C Meds - See DC Summary DVT Prophylaxis - Xarelto  Avel Peace, PA-C Orthopaedic Surgery 10/03/2016, 8:55 AM

## 2016-10-03 NOTE — Evaluation (Signed)
Physical Therapy Evaluation Patient Details Name: Ashley Mcclain MRN: [id number] DOB: 1965/12/31 Today's Date: 10/03/2016   History of Present Illness  Pt s/p R THR  Clinical Impression  Pt s/p R THR and presents with decreased R LE strength/ROM and post op pain limiting functional mobility.  Pt should progress to dc home with assist of family.    Follow Up Recommendations Home health PT    Equipment Recommendations  None recommended by PT    Recommendations for Other Services OT consult     Precautions / Restrictions Precautions Precautions: Fall Precaution Comments: Nausea/dizziness on eval Restrictions Weight Bearing Restrictions: No      Mobility  Bed Mobility Overal bed mobility: Needs Assistance Bed Mobility: Supine to Sit     Supine to sit: Min assist     General bed mobility comments: cues for sequence and use of L LE to self assist  Transfers Overall transfer level: Needs assistance Equipment used: Rolling walker (2 wheeled) Transfers: Sit to/from Stand Sit to Stand: Min guard         General transfer comment: cues for LE management and use of UEs to self assist  Ambulation/Gait Ambulation/Gait assistance: Min assist;Min guard Ambulation Distance (Feet): 20 Feet (20' twice to/from bathroom) Assistive device: Rolling walker (2 wheeled) Gait Pattern/deviations: Step-to pattern;Decreased step length - right;Decreased step length - left;Shuffle;Trunk flexed Gait velocity: decr Gait velocity interpretation: Below normal speed for age/gender General Gait Details: cues for sequence, posture and position from RW - distance ltd by onset dizziness/nausea - BP 121/55  Stairs            Wheelchair Mobility    Modified Rankin (Stroke Patients Only)       Balance Overall balance assessment: No apparent balance deficits (not formally assessed)                                           Pertinent Vitals/Pain Pain Assessment:  0-10 Pain Score: 3  Pain Location: R hip Pain Descriptors / Indicators: Aching;Sore Pain Intervention(s): Limited activity within patient's tolerance;Monitored during session;Premedicated before session;Ice applied    Home Living Family/patient expects to be discharged to:: Private residence Living Arrangements: Parent Available Help at Discharge: Family;Available PRN/intermittently Type of Home: House Home Access: Level entry     Home Layout: Two level;Able to live on main level with bedroom/bathroom Home Equipment: Dan Humphreys - 2 wheels;Crutches;Bedside commode      Prior Function Level of Independence: Independent               Hand Dominance        Extremity/Trunk Assessment   Upper Extremity Assessment Upper Extremity Assessment: Overall WFL for tasks assessed    Lower Extremity Assessment Lower Extremity Assessment: RLE deficits/detail RLE Deficits / Details: Strength at hip 2+/5 with AAROM at hip to 85 flex and 20 abd    Cervical / Trunk Assessment Cervical / Trunk Assessment: Normal  Communication   Communication: No difficulties  Cognition Arousal/Alertness: Awake/alert Behavior During Therapy: WFL for tasks assessed/performed Overall Cognitive Status: Within Functional Limits for tasks assessed                                        General Comments      Exercises Total Joint Exercises Ankle Circles/Pumps: AROM;Both;20  reps;Supine Quad Sets: AROM;Both;10 reps;Supine Heel Slides: AAROM;Right;20 reps;Supine Hip ABduction/ADduction: AAROM;Right;15 reps;Supine   Assessment/Plan    PT Assessment Patient needs continued PT services  PT Problem List Decreased strength;Decreased range of motion;Decreased activity tolerance;Decreased mobility;Decreased knowledge of use of DME;Pain       PT Treatment Interventions DME instruction;Gait training;Stair training;Functional mobility training;Therapeutic activities;Therapeutic  exercise;Patient/family education    PT Goals (Current goals can be found in the Care Plan section)  Acute Rehab PT Goals Patient Stated Goal: Regain IND and resume active lifestyle PT Goal Formulation: With patient Time For Goal Achievement: 10/05/16 Potential to Achieve Goals: Good    Frequency 7X/week   Barriers to discharge        Co-evaluation               End of Session Equipment Utilized During Treatment: Gait belt Activity Tolerance: Patient limited by fatigue;Other (comment) (nausea/dizziness) Patient left: in chair;with call bell/phone within reach;with chair alarm set Nurse Communication: Mobility status;Other (comment) (nausea/dizziness) PT Visit Diagnosis: Unsteadiness on feet (R26.81);Difficulty in walking, not elsewhere classified (R26.2)    Time: 1610-9604 PT Time Calculation (min) (ACUTE ONLY): 38 min   Charges:   PT Evaluation $PT Eval Low Complexity: 1 Procedure PT Treatments $Gait Training: 8-22 mins $Therapeutic Exercise: 8-22 mins   PT G Codes:        Pg [phone]   Annelie Boak 10/03/2016, 12:25 PM

## 2016-10-03 NOTE — Progress Notes (Signed)
Discharge planning, spoke with patient at bedside. Have chosen [facility] for [facility]. Contacted [facility] for referral. Has RW, husband will get a 3-n-1. [phone]

## 2016-10-03 NOTE — Discharge Instructions (Addendum)
° °Dr. Frank Aluisio °Total Joint Specialist °[facility] °[address] °[place], [place] [address] °[phone] ° °ANTERIOR APPROACH TOTAL HIP REPLACEMENT POSTOPERATIVE DIRECTIONS ° ° °Hip Rehabilitation, Guidelines Following Surgery  °The results of a hip operation are greatly improved after range of motion and muscle strengthening exercises. Follow all safety measures which are given to protect your hip. If any of these exercises cause increased pain or swelling in your joint, decrease the amount until you are comfortable again. Then slowly increase the exercises. Call your caregiver if you have problems or questions.  ° °HOME CARE INSTRUCTIONS  °Remove items at home which could result in a fall. This includes throw rugs or furniture in walking pathways.  °· ICE to the affected hip every three hours for 30 minutes at a time and then as needed for pain and swelling.  Continue to use ice on the hip for pain and swelling from surgery. You may notice swelling that will progress down to the foot and ankle.  This is normal after surgery.  Elevate the leg when you are not up walking on it.   °· Continue to use the breathing machine which will help keep your temperature down.  It is common for your temperature to cycle up and down following surgery, especially at night when you are not up moving around and exerting yourself.  The breathing machine keeps your lungs expanded and your temperature down. ° ° °DIET °You may resume your previous home diet once your are discharged from the hospital. ° °DRESSING / WOUND CARE / SHOWERING °You may shower 3 days after surgery, but keep the wounds dry during showering.  You may use an occlusive plastic wrap (Press'n Seal for example), NO SOAKING/SUBMERGING IN THE BATHTUB.  If the bandage gets wet, change with a clean dry gauze.  If the incision gets wet, pat the wound dry with a clean towel. °You may start showering once you are discharged home but do not  submerge the incision under water. Just pat the incision dry and apply a dry gauze dressing on daily. °Change the surgical dressing daily and reapply a dry dressing each time. ° °ACTIVITY °Walk with your walker as instructed. °Use walker as long as suggested by your caregivers. °Avoid periods of inactivity such as sitting longer than an hour when not asleep. This helps prevent blood clots.  °You may resume a sexual relationship in one month or when given the OK by your doctor.  °You may return to work once you are cleared by your doctor.  °Do not drive a car for 6 weeks or until released by you surgeon.  °Do not drive while taking narcotics. ° °WEIGHT BEARING °Weight bearing as tolerated with assist device (walker, cane, etc) as directed, use it as long as suggested by your surgeon or therapist, typically at least 4-6 weeks. ° °POSTOPERATIVE CONSTIPATION PROTOCOL °Constipation - defined medically as fewer than three stools per week and severe constipation as less than one stool per week. ° °One of the most common issues patients have following surgery is constipation.  Even if you have a regular bowel pattern at home, your normal regimen is likely to be disrupted due to multiple reasons following surgery.  Combination of anesthesia, postoperative narcotics, change in appetite and fluid intake all can affect your bowels.  In order to avoid complications following surgery, here are some recommendations in order to help you during your recovery period. ° °Colace (docusate) - Pick up an over-the-counter   form of Colace or another stool softener and take twice a day as long as you are requiring postoperative pain medications.  Take with a full glass of water daily.  If you experience loose stools or diarrhea, hold the colace until you stool forms back up.  If your symptoms do not get better within 1 week or if they get worse, check with your doctor. ° °Dulcolax (bisacodyl) - Pick up over-the-counter and take as directed  by the product packaging as needed to assist with the movement of your bowels.  Take with a full glass of water.  Use this product as needed if not relieved by Colace only.  ° °MiraLax (polyethylene glycol) - Pick up over-the-counter to have on hand.  MiraLax is a solution that will increase the amount of water in your bowels to assist with bowel movements.  Take as directed and can mix with a glass of water, juice, soda, coffee, or tea.  Take if you go more than two days without a movement. °Do not use MiraLax more than once per day. Call your doctor if you are still constipated or irregular after using this medication for 7 days in a row. ° °If you continue to have problems with postoperative constipation, please contact the office for further assistance and recommendations.  If you experience "the worst abdominal pain ever" or develop nausea or vomiting, please contact the office immediatly for further recommendations for treatment. ° °ITCHING ° If you experience itching with your medications, try taking only a single pain pill, or even half a pain pill at a time.  You can also use Benadryl over the counter for itching or also to help with sleep.  ° °TED HOSE STOCKINGS °Wear the elastic stockings on both legs for three weeks following surgery during the day but you may remove then at night for sleeping. ° °MEDICATIONS °See your medication summary on the “After Visit Summary” that the nursing staff will review with you prior to discharge.  You may have some home medications which will be placed on hold until you complete the course of blood thinner medication.  It is important for you to complete the blood thinner medication as prescribed by your surgeon.  Continue your approved medications as instructed at time of discharge. ° °PRECAUTIONS °If you experience chest pain or shortness of breath - call 911 immediately for transfer to the hospital emergency department.  °If you develop a fever greater that 101 F,  purulent drainage from wound, increased redness or drainage from wound, foul odor from the wound/dressing, or calf pain - CONTACT YOUR SURGEON.   °                                                °FOLLOW-UP APPOINTMENTS °Make sure you keep all of your appointments after your operation with your surgeon and caregivers. You should call the office at the above phone number and make an appointment for approximately two weeks after the date of your surgery or on the date instructed by your surgeon outlined in the "After Visit Summary". ° °RANGE OF MOTION AND STRENGTHENING EXERCISES  °These exercises are designed to help you keep full movement of your hip joint. Follow your caregiver's or physical therapist's instructions. Perform all exercises about fifteen times, three times per day or as directed. Exercise both hips, even if you   have had only one joint replacement. These exercises can be done on a training (exercise) mat, on the floor, on a table or on a bed. Use whatever works the best and is most comfortable for you. Use music or television while you are exercising so that the exercises are a pleasant break in your day. This will make your life better with the exercises acting as a break in routine you can look forward to.  °Lying on your back, slowly slide your foot toward your buttocks, raising your knee up off the floor. Then slowly slide your foot back down until your leg is straight again.  °Lying on your back spread your legs as far apart as you can without causing discomfort.  °Lying on your side, raise your upper leg and foot straight up from the floor as far as is comfortable. Slowly lower the leg and repeat.  °Lying on your back, tighten up the muscle in the front of your thigh (quadriceps muscles). You can do this by keeping your leg straight and trying to raise your heel off the floor. This helps strengthen the largest muscle supporting your knee.  °Lying on your back, tighten up the muscles of your  buttocks both with the legs straight and with the knee bent at a comfortable angle while keeping your heel on the floor.  ° °IF YOU ARE TRANSFERRED TO A SKILLED REHAB FACILITY °If the patient is transferred to a skilled rehab facility following release from the hospital, a list of the current medications will be sent to the facility for the patient to continue.  When discharged from the skilled rehab facility, please have the facility set up the patient's Home Health Physical Therapy prior to being released. Also, the skilled facility will be responsible for providing the patient with their medications at time of release from the facility to include their pain medication, the muscle relaxants, and their blood thinner medication. If the patient is still at the rehab facility at time of the two week follow up appointment, the skilled rehab facility will also need to assist the patient in arranging follow up appointment in our office and any transportation needs. ° °MAKE SURE YOU:  °Understand these instructions.  °Get help right away if you are not doing well or get worse.  ° ° °Pick up stool softner and laxative for home use following surgery while on pain medications. °Do not submerge incision under water. °Please use good hand washing techniques while changing dressing each day. °May shower starting three days after surgery. °Please use a clean towel to pat the incision dry following showers. °Continue to use ice for pain and swelling after surgery. °Do not use any lotions or creams on the incision until instructed by your surgeon. ° °Take Xarelto for two and a half more weeks following discharge from the hospital, then discontinue Xarelto. °Once the patient has completed the blood thinner regimen, then take a Baby 81 mg Aspirin daily for three more weeks. ° ° ° °Information on my medicine - XARELTO® (Rivaroxaban) ° °This medication education was reviewed with me or my healthcare representative as part of my  discharge preparation.  The pharmacist that spoke with me during my hospital stay was:  Bell, Michelle T, RPH ° °Why was Xarelto® prescribed for you? °Xarelto® was prescribed for you to reduce the risk of blood clots forming after orthopedic surgery. The medical term for these abnormal blood clots is venous thromboembolism (VTE). ° °What do you need to know   about xarelto® ? °Take your Xarelto® ONCE DAILY at the same time every day. °You may take it either with or without food. ° °If you have difficulty swallowing the tablet whole, you may crush it and mix in applesauce just prior to taking your dose. ° °Take Xarelto® exactly as prescribed by your doctor and DO NOT stop taking Xarelto® without talking to the doctor who prescribed the medication.  Stopping without other VTE prevention medication to take the place of Xarelto® may increase your risk of developing a clot. ° °After discharge, you should have regular check-up appointments with your healthcare provider that is prescribing your Xarelto®.   ° °What do you do if you miss a dose? °If you miss a dose, take it as soon as you remember on the same day then continue your regularly scheduled once daily regimen the next day. Do not take two doses of Xarelto® on the same day.  ° °Important Safety Information °A possible side effect of Xarelto® is bleeding. You should call your healthcare provider right away if you experience any of the following: °? Bleeding from an injury or your nose that does not stop. °? Unusual colored urine (red or dark brown) or unusual colored stools (red or black). °? Unusual bruising for unknown reasons. °? A serious fall or if you hit your head (even if there is no bleeding). ° °Some medicines may interact with Xarelto® and might increase your risk of bleeding while on Xarelto®. To help avoid this, consult your healthcare provider or pharmacist prior to using any new prescription or non-prescription medications, including herbals, vitamins,  non-steroidal anti-inflammatory drugs (NSAIDs) and supplements. ° °This website has more information on Xarelto®: www.xarelto.com. ° ° °

## 2016-10-03 NOTE — Evaluation (Signed)
Occupational Therapy Evaluation Patient Details Name: Ashley Mcclain MRN: [id number] DOB: 21-Jun-1965 Today's Date: 10/03/2016    History of Present Illness Pt s/p R THR   Clinical Impression   This 51 year old female was admitted for the above sx. All education was completed. No further Ashley is needed at this time    Follow Up Recommendations  No Ashley follow up    Equipment Recommendations  None recommended by Ashley    Recommendations for Other Services       Precautions / Restrictions Precautions Precautions: Fall Precaution Comments: a little queasy with Ashley.  BP 111/61 and 110/61 Restrictions Weight Bearing Restrictions: No      Mobility Bed Mobility Overal bed mobility: Needs Assistance Bed Mobility: Supine to Sit     Supine to sit: Min assist     General bed mobility comments: NT - pt up in chair and requests back to same  Transfers Overall transfer level: Needs assistance Equipment used: Rolling walker (2 wheeled) Transfers: Sit to/from Stand Sit to Stand: Supervision         General transfer comment: min cues for LE management and use of UEs to self assist    Balance Overall balance assessment: No apparent balance deficits (not formally assessed)                                         ADL either performed or assessed with clinical judgement   ADL Overall ADL's : Needs assistance/impaired     Grooming: Supervision/safety;Standing   Upper Body Bathing: Set up;Sitting   Lower Body Bathing: Minimal assistance;Sit to/from stand   Upper Body Dressing : Set up;Sitting   Lower Body Dressing: Moderate assistance;Sit to/from stand   Toilet Transfer: Min guard;Ambulation;BSC;RW   Toileting- Clothing Manipulation and Hygiene: Supervision/safety;Sit to/from stand   Tub/ Shower Transfer: Min guard;Ambulation;Walk-in shower     General ADL Comments: Pt can borrow AE. Didn't feel she needed to practice with it.  Reviewed general safety and  working within pain tolerance     Vision         Perception     Praxis      Pertinent Vitals/Pain Pain Assessment: 0-10 Pain Score: 3  Pain Location: R hip Pain Descriptors / Indicators: Aching;Sore Pain Intervention(s): Limited activity within patient's tolerance;Monitored during session;Premedicated before session;Repositioned;Ice applied     Hand Dominance     Extremity/Trunk Assessment Upper Extremity Assessment Upper Extremity Assessment: Overall WFL for tasks assessed      Cervical / Trunk Assessment Cervical / Trunk Assessment: Normal   Communication Communication Communication: No difficulties   Cognition Arousal/Alertness: Awake/alert Behavior During Therapy: WFL for tasks assessed/performed Overall Cognitive Status: Within Functional Limits for tasks assessed                                     General Comments       Exercises    Shoulder Instructions      Home Living Family/patient expects to be discharged to:: Private residence Living Arrangements: Parent Available Help at Discharge: Family;Available PRN/intermittently Type of Home: House Home Access: Level entry     Home Layout: Two level;Able to live on main level with bedroom/bathroom Alternate Level Stairs-Number of Steps: 14 Alternate Level Stairs-Rails: Right Bathroom Shower/Tub: Producer, television/film/video: Handicapped  height     Home Equipment: Bedside commode;Shower seat;Walker - 2 wheels;Crutches   Additional Comments: can borrow DME from parents      Prior Functioning/Environment Level of Independence: Independent                 Ashley Problem List:        Ashley Treatment/Interventions:      Ashley Goals(Current goals can be found in the care plan section) Acute Rehab Ashley Goals Patient Stated Goal: Regain IND and resume active lifestyle Ashley Goal Formulation: All assessment and education complete, DC therapy  Ashley Frequency:     Barriers to D/C:             Co-evaluation              End of Session    Activity Tolerance: Patient tolerated treatment well (pt a little queasy) Patient left: in chair;with call bell/phone within reach;with family/visitor present  Ashley Mcclain: Pain Pain - Right/Left: Right Pain - part of body: Hip                Time: 1610-9604 Ashley Time Calculation (min): 20 min Charges:  Ashley General Charges $Ashley Visit: 1 Procedure Ashley Evaluation $Ashley Eval Low Complexity: 1 Procedure G-Codes:     Ashley Mcclain, OTR/L [phone] 10/03/2016  Ashley Mcclain 10/03/2016, 3:33 PM

## 2017-01-08 DIAGNOSIS — A770 Spotted fever due to Rickettsia rickettsii: Secondary | ICD-10-CM

## 2017-01-08 HISTORY — DX: Spotted fever due to Rickettsia rickettsii: A77.0

## 2017-01-13 ENCOUNTER — Other Ambulatory Visit: Payer: Self-pay | Admitting: Physician Assistant

## 2017-01-13 DIAGNOSIS — N63 Unspecified lump in unspecified breast: Secondary | ICD-10-CM

## 2017-01-22 ENCOUNTER — Other Ambulatory Visit: Payer: Self-pay | Admitting: Physician Assistant

## 2017-01-22 DIAGNOSIS — N63 Unspecified lump in unspecified breast: Secondary | ICD-10-CM

## 2017-01-27 ENCOUNTER — Ambulatory Visit
Admission: RE | Admit: 2017-01-27 | Discharge: 2017-01-27 | Disposition: A | Discharge status: Home / Self Care | Payer: 59 | Source: Ambulatory Visit | Attending: Physician Assistant | Admitting: Physician Assistant

## 2017-01-27 DIAGNOSIS — N63 Unspecified lump in unspecified breast: Secondary | ICD-10-CM

## 2017-06-10 DIAGNOSIS — J111 Influenza due to unidentified influenza virus with other respiratory manifestations: Secondary | ICD-10-CM

## 2017-06-10 HISTORY — DX: Influenza due to unidentified influenza virus with other respiratory manifestations: J11.1

## 2017-09-05 ENCOUNTER — Other Ambulatory Visit: Payer: Self-pay

## 2017-09-05 ENCOUNTER — Encounter: Payer: Self-pay | Admitting: Obstetrics & Gynecology

## 2017-09-05 ENCOUNTER — Ambulatory Visit (INDEPENDENT_AMBULATORY_CARE_PROVIDER_SITE_OTHER): Payer: 59 | Admitting: Obstetrics & Gynecology

## 2017-09-05 VITALS — BP 102/64 | HR 78 | Resp 16 | Ht 63.5 in | Wt 137.0 lb

## 2017-09-05 DIAGNOSIS — Z01419 Encounter for gynecological examination (general) (routine) without abnormal findings: Secondary | ICD-10-CM | POA: Diagnosis not present

## 2017-09-05 DIAGNOSIS — Z23 Encounter for immunization: Secondary | ICD-10-CM | POA: Diagnosis not present

## 2017-09-05 DIAGNOSIS — I7300 Raynaud's syndrome without gangrene: Secondary | ICD-10-CM | POA: Diagnosis not present

## 2017-09-05 NOTE — Progress Notes (Signed)
52 y.o. [name] DivorcedCaucasianF here for annual exam.  Doing well.  Has been diagnosed with Raynaud's phenomenon that affects the first two fingers on each hand.    Denies vaginal bleeding.  Denies vasomotor symptoms.    Mirena IUD 03/16/14.   PCP:  Dr. Zachery Dauer.  Had blood work last year.    No LMP recorded. (Menstrual status: IUD).          Sexually active: No.  The current method of family planning is IUD.   Mirena placed 03/16/14 Exercising: Yes.    cardio, dance, walking Smoker:  no  Health Maintenance: Pap:  ASCUS. HR HPV:neg 2/14, neg pap 8/16, 12/17 neg pap with neg HR HPV   History of abnormal Pap:  Yes MMG:  01/27/17 [place] right BIRADS2:Benign. F/u 1 year   Colonoscopy:  04/01/13 polyps. f/u 5 years  BMD:   never TDaP:  Unsure.  Updated today. Pneumonia vaccine(s):  n/a Shingrix:   D/w pt shingrix vaccine Hep C testing: n/a Screening Labs: PCP   reports that she has never smoked. She has never used smokeless tobacco. She reports that she does not drink alcohol or use drugs.  Past Medical History:  Diagnosis Date  . Anemia    Mild and was told she had low B12 in the past  . Ear ache   . Environmental exposure    To grass  . Influenza 06/2017  . Numbness in left leg 03/26/2013  . [place] spotted fever 01/2017   due to tick bite     Past Surgical History:  Procedure Laterality Date  . bladder dilation  about 7 years ago  . COLONOSCOPY W/ POLYPECTOMY  2014   x3  . TONSILECTOMY, ADENOIDECTOMY, BILATERAL MYRINGOTOMY AND TUBES    . TOTAL HIP ARTHROPLASTY Right 10/02/2016   Procedure: RIGHT TOTAL HIP ARTHROPLASTY ANTERIOR APPROACH;  Surgeon: Ollen Gross, MD;  Location: [facility];  Service: Orthopedics;  Laterality: Right;    Current Outpatient Medications  Medication Sig Dispense Refill  . Nutritional Supplements (JUICE PLUS FIBRE PO) Take by mouth daily.     No current facility-administered medications for this visit.     Family History  Problem Relation  Age of Onset  . Fibromyalgia Mother   . Arthritis Father   . Hypertension Father   . Hypertension Brother   . Diabetes Maternal Grandfather   . Stroke Maternal Uncle     Review of Systems  All other systems reviewed and are negative.   Exam:   BP 102/64 (BP Location: Right Arm, Patient Position: Sitting, Cuff Size: Normal)   Pulse 78   Resp 16   Ht 5' 3.5" (1.613 m)   Wt 137 lb (62.1 kg)   BMI 23.89 kg/m   Height:   Height: 5' 3.5" (161.3 cm)  Ht Readings from Last 3 Encounters:  09/05/17 5' 3.5" (1.613 m)  10/02/16 5\' 2"  (1.575 m)  09/25/16 5\' 2"  (1.575 m)    General appearance: alert, cooperative and appears stated age Head: Normocephalic, without obvious abnormality, atraumatic Neck: no adenopathy, supple, symmetrical, trachea midline and thyroid normal to inspection and palpation Lungs: clear to auscultation bilaterally Breasts: normal appearance, no masses or tenderness Heart: regular rate and rhythm Abdomen: soft, non-tender; bowel sounds normal; no masses,  no organomegaly Extremities: extremities normal, atraumatic, no cyanosis or edema Skin: Skin color, texture, turgor normal. No rashes or lesions Lymph nodes: Cervical, supraclavicular, and axillary nodes normal. No abnormal inguinal nodes palpated Neurologic: Grossly normal  Pelvic: External genitalia:  no lesions              Urethra:  normal appearing urethra with no masses, tenderness or lesions              Bartholins and Skenes: normal                 Vagina: normal appearing vagina with normal color and discharge, no lesions              Cervix: no lesions, IUD string noted              Pap taken: No. Bimanual Exam:  Uterus:  normal size, contour, position, consistency, mobility, non-tender              Adnexa: normal adnexa and no mass, fullness, tenderness               Rectovaginal: Confirms               Anus:  normal sphincter tone, no lesions  Chaperone was present for exam.  A:  Well Woman  with normal exam H/O menorrhagia with amenorrhea after Mirena IUD placed 03/24/16 H/O ASCUS pap with neg HR HPV 2914, 2917 H/O cervical polyp  P:   Mammogram guidelines reviewed.  Doing 3D MMG. pap smear ASCUS with neg HR HPV 12/17 Lab work done 12/17 Tdap will be updated today Colonoscopy due later this year return annually or prn

## 2017-09-05 NOTE — Patient Instructions (Signed)
Ashley Readingwight Bates, MD [address] [phone]

## 2018-10-16 ENCOUNTER — Telehealth: Payer: Self-pay | Admitting: *Deleted

## 2018-10-16 ENCOUNTER — Emergency Department (HOSPITAL_BASED_OUTPATIENT_CLINIC_OR_DEPARTMENT_OTHER)
Admission: EM | Admit: 2018-10-16 | Discharge: 2018-10-16 | Disposition: A | Discharge status: Home / Self Care | Payer: Managed Care, Other (non HMO) | Attending: Emergency Medicine | Admitting: Emergency Medicine

## 2018-10-16 ENCOUNTER — Emergency Department (HOSPITAL_BASED_OUTPATIENT_CLINIC_OR_DEPARTMENT_OTHER): Payer: Managed Care, Other (non HMO)

## 2018-10-16 ENCOUNTER — Other Ambulatory Visit: Payer: Self-pay

## 2018-10-16 ENCOUNTER — Encounter (HOSPITAL_BASED_OUTPATIENT_CLINIC_OR_DEPARTMENT_OTHER): Payer: Self-pay

## 2018-10-16 DIAGNOSIS — Z79899 Other long term (current) drug therapy: Secondary | ICD-10-CM | POA: Insufficient documentation

## 2018-10-16 DIAGNOSIS — N8301 Follicular cyst of right ovary: Secondary | ICD-10-CM | POA: Diagnosis not present

## 2018-10-16 DIAGNOSIS — R1084 Generalized abdominal pain: Secondary | ICD-10-CM | POA: Insufficient documentation

## 2018-10-16 DIAGNOSIS — N83201 Unspecified ovarian cyst, right side: Secondary | ICD-10-CM

## 2018-10-16 DIAGNOSIS — M546 Pain in thoracic spine: Secondary | ICD-10-CM | POA: Diagnosis not present

## 2018-10-16 DIAGNOSIS — R0789 Other chest pain: Secondary | ICD-10-CM

## 2018-10-16 DIAGNOSIS — R109 Unspecified abdominal pain: Secondary | ICD-10-CM | POA: Diagnosis not present

## 2018-10-16 LAB — CBC WITH DIFFERENTIAL/PLATELET
Abs Immature Granulocytes: 0.03 10*3/uL (ref 0.00–0.07)
Basophils Absolute: 0.1 10*3/uL (ref 0.0–0.1)
Basophils Relative: 1 %
Eosinophils Absolute: 0.2 10*3/uL (ref 0.0–0.5)
Eosinophils Relative: 2 %
HCT: 41.4 % (ref 36.0–46.0)
Hemoglobin: 13.5 g/dL (ref 12.0–15.0)
Immature Granulocytes: 0 %
Lymphocytes Relative: 24 %
Lymphs Abs: 2.2 10*3/uL (ref 0.7–4.0)
MCH: 30.5 pg (ref 26.0–34.0)
MCHC: 32.6 g/dL (ref 30.0–36.0)
MCV: 93.5 fL (ref 80.0–100.0)
Monocytes Absolute: 0.9 10*3/uL (ref 0.1–1.0)
Monocytes Relative: 9 %
Neutro Abs: 5.9 10*3/uL (ref 1.7–7.7)
Neutrophils Relative %: 64 %
Platelets: 250 10*3/uL (ref 150–400)
RBC: 4.43 MIL/uL (ref 3.87–5.11)
RDW: 12 % (ref 11.5–15.5)
WBC: 9.3 10*3/uL (ref 4.0–10.5)
nRBC: 0 % (ref 0.0–0.2)

## 2018-10-16 LAB — COMPREHENSIVE METABOLIC PANEL
ALT: 16 U/L (ref 0–44)
AST: 16 U/L (ref 15–41)
Albumin: 4.3 g/dL (ref 3.5–5.0)
Alkaline Phosphatase: 41 U/L (ref 38–126)
Anion gap: 6 (ref 5–15)
BUN: 13 mg/dL (ref 6–20)
CO2: 25 mmol/L (ref 22–32)
Calcium: 8.8 mg/dL — ABNORMAL LOW (ref 8.9–10.3)
Chloride: 106 mmol/L (ref 98–111)
Creatinine, Ser: 0.64 mg/dL (ref 0.44–1.00)
GFR calc Af Amer: >60 mL/min (ref >60)
GFR calc non Af Amer: >60 mL/min (ref >60)
Glucose, Bld: 108 mg/dL — ABNORMAL HIGH (ref 70–99)
Potassium: 3.7 mmol/L (ref 3.5–5.1)
Sodium: 137 mmol/L (ref 135–145)
Total Bilirubin: 0.3 mg/dL (ref 0.3–1.2)
Total Protein: 6.9 g/dL (ref 6.5–8.1)

## 2018-10-16 LAB — URINALYSIS, ROUTINE W REFLEX MICROSCOPIC
Bilirubin Urine: NEGATIVE
Glucose, UA: 100 mg/dL — AB
Hgb urine dipstick: NEGATIVE
Ketones, ur: NEGATIVE mg/dL
Leukocytes,Ua: NEGATIVE
Nitrite: NEGATIVE
Protein, ur: NEGATIVE mg/dL
Specific Gravity, Urine: 1.015 (ref 1.005–1.030)
pH: 6 (ref 5.0–8.0)

## 2018-10-16 LAB — LIPASE, BLOOD: Lipase: 33 U/L (ref 11–51)

## 2018-10-16 LAB — TROPONIN I: Troponin I: <0.03 ng/mL (ref <0.03)

## 2018-10-16 LAB — PREGNANCY, URINE: Preg Test, Ur: NEGATIVE

## 2018-10-16 MED ORDER — IOHEXOL 350 MG/ML SOLN
100.0000 mL | Freq: Once | INTRAVENOUS | Status: AC | PRN
  Administered 2018-10-16: 22:00:00 100 mL via INTRAVENOUS

## 2018-10-16 MED ORDER — MORPHINE SULFATE (PF) 4 MG/ML IV SOLN: 4.0000 mg | Freq: Once | INTRAVENOUS | Status: DC

## 2018-10-16 NOTE — Telephone Encounter (Signed)
Please have patient do pregnancy test if she is sexually active and there is any chance of pregnancy.   Cc- Dr. Hyacinth Meeker

## 2018-10-16 NOTE — Telephone Encounter (Signed)
Spoke with patient. Reports indigestion, bloating, abdominal discomfort and mid back pain that started 1 wk ago. States "stomach just feels full".  Reports normal BM daily. Denies pelvic pain, N/V, fever/chills, urinary symptoms, vaginal d/c or odor.   Mirena IUD for contraceptive, placed 03/16/14. Did not have menses with IUD initially, has noticed spotting once a month for the past few months. Notices a vaginal odor when spotting, resolves after bleeding stops. Patient states she thinks this may be due to IUD needing to be exchanged, requesting OV with Dr. Hyacinth Meeker to discuss, declines OV with covering provider.   Recommended patient f/u with PCP for further evaluation of GI/abdominal symptoms. OV scheduled with Dr. Hyacinth Meeker on 5/18 at 9am for spotting with IUD. Advised covering provider will review, our office will return call if any additional recommendations.   Routing to provider for final review. Patient is agreeable to disposition. Will close encounter.  Cc: Dr. Hyacinth Meeker

## 2018-10-16 NOTE — ED Provider Notes (Deleted)
Medical screening examination/treatment/procedure(s) were conducted as a shared visit with non-physician practitioner(s) and myself.  I personally evaluated the patient during the encounter.  None Patient has significant medical history with comorbid illness.  He had acute onset of vomiting and diarrhea yesterday.  She has been unable to tolerate anything today.  She did have cramping or abdominal pain.  Frequent diarrhea.  Patient has been rehydrated with a liter of fluids and been given pain and nausea medication.  She reports she is starting to feel much better.  Patient is alert and appropriate.  No respiratory distress.  Color good mental status clear.  CT shows no acute findings.  With rehydration and if patient is no longer vomiting and tolerating orals will be likely stable for discharge home with antiemetics.   Arby Barrette, MD 10/16/18 2256

## 2018-10-16 NOTE — ED Triage Notes (Addendum)
C/o abd pain, left flank pain-sx x 1 week-feeling of indigestion and numbness/tinlging to left arm x today-was sent by PCP-NAD-steady gait

## 2018-10-16 NOTE — Telephone Encounter (Signed)
Patient is having abdominal pain and pressure.

## 2018-10-16 NOTE — ED Notes (Signed)
Patient transported to CT 

## 2018-10-16 NOTE — Discharge Instructions (Signed)
Please make sure that you are drinking plenty of fluid in general and tonight to help your body flush the contrast out.   Please start taking MiraLAX.  I would recommend taking 1 capful tomorrow morning.  If you do not have any significant cramping or discomfort you may take another capful at night.  You may take up to 5 or 6 doses in a day with enough water.  The more you take the more likely you are to have crampy abdominal pain.  If you only take 1 or 2 doses a day it may take 2 to 3 days for you to have a significant increase bowel movement.

## 2018-10-16 NOTE — Telephone Encounter (Signed)
Spoke with patient, advised as seen below per Dr. Edward Jolly. Patient states she is not currently sexually active, "no chance of pregnancy". She does have a Virtual visit scheduled for today with her PCP.   Routing to provider for final review. Patient is agreeable to disposition. Will close encounter.

## 2018-10-16 NOTE — ED Provider Notes (Signed)
[facility] EMERGENCY DEPARTMENT Provider Note   CSN: [id number] Arrival date & time: 10/16/18  1939    History   Chief Complaint Chief Complaint  Patient presents with   Abdominal Pain    HPI Ashley Mcclain is a 53 y.o. female with a past medical history of interstitial cystitis, who presents today for evaluation of abdominal pain, chest pain, and left arm abnormal sensation.  She reports that for approximately 1 week she has had generalized abdominal pain and bloating.  She reports that she has been attempting to treat this with antacids.  She denies any nausea or vomiting.  She is still having normal bowel movements.  She has never had any abdominal surgeries before.  She denies any abnormal vaginal discharge or bleeding.  No dysuria or urgency however she does report increased frequency which she feels is related due to her abdomen being full.  She reports that for the past 2 to 3 days she has developed left-sided upper chest pressure and pain in her left sided thoracic back in addition to the unchanged abdominal feeling of fullness.  She denies any injuries or abnormal activities.  Progressed until this morning at about 9 AM when she went to work and she noticed that her left arm felt funny.  She describes it as decreased sensation that feels like static.  She denies any weakness.  No headache or neck pain.     HPI  Past Medical History:  Diagnosis Date   Anemia    Mild and was told she had low B12 in the past   Ear ache    Environmental exposure    To grass   Influenza 06/2017   Numbness in left leg 03/26/2013   Raynaud's phenomenon    [facility] spotted fever 01/2017   due to tick bite     Patient Active Problem List   Diagnosis Date Noted   Raynaud phenomenon 09/05/2017   OA (osteoarthritis) of hip 10/02/2016   Fecal incontinence 01/18/2014   Peripheral sensory neuropathy 01/18/2014   Menorrhagia with regular cycle 01/18/2014   Numbness in  left leg 03/26/2013    Past Surgical History:  Procedure Laterality Date   bladder dilation  about 7 years ago   COLONOSCOPY W/ POLYPECTOMY  2014   x3   TONSILLECTOMY AND ADENOIDECTOMY     TOTAL HIP ARTHROPLASTY Right 10/02/2016   Procedure: RIGHT TOTAL HIP ARTHROPLASTY ANTERIOR APPROACH;  Surgeon: Ollen Gross, MD;  Location: [facility] ORS;  Service: Orthopedics;  Laterality: Right;     OB History    Gravida  2   Para  2   Term  2   Preterm      AB      Living  2     SAB      TAB      Ectopic      Multiple      Live Births  2            Home Medications    Prior to Admission medications   Medication Sig Start Date End Date Taking? Authorizing Provider  Nutritional Supplements (JUICE PLUS FIBRE PO) Take by mouth daily.    [provider]    Family History Family History  Problem Relation Age of Onset   Fibromyalgia Mother    Arthritis Father    Hypertension Father    Hypertension Brother    Diabetes Maternal Grandfather    Stroke Maternal Uncle  Social History Social History   Tobacco Use   Smoking status: Never Smoker   Smokeless tobacco: Never Used  Substance Use Topics   Alcohol use: No    Alcohol/week: 0.0 standard drinks   Drug use: No     Allergies   Gluten meal   Review of Systems Review of Systems  Constitutional: Negative for chills and fever.  Cardiovascular: Positive for chest pain. Negative for palpitations and leg swelling.  Gastrointestinal: Positive for abdominal distention and abdominal pain. Negative for constipation, diarrhea, nausea and vomiting.  Genitourinary: Positive for frequency. Negative for dysuria and urgency.  Musculoskeletal: Positive for back pain. Negative for neck pain.  Neurological: Negative for weakness, light-headedness and headaches.       Decreased sensation in left arm  Psychiatric/Behavioral: Negative for confusion.  All other systems reviewed and are  negative.    Physical Exam Updated Vital Signs BP 107/69    Pulse 74    Temp 98.1 F (36.7 C) (Oral)    Resp 15    Ht 5\' 4"  (1.626 m)    Wt 64 kg    SpO2 99%    BMI 24.20 kg/m   Physical Exam Vitals signs and nursing note reviewed.  Constitutional:      General: She is not in acute distress.    Appearance: She is well-developed.  HENT:     Head: Normocephalic and atraumatic.     Mouth/Throat:     Mouth: Mucous membranes are moist.  Eyes:     Conjunctiva/sclera: Conjunctivae normal.  Neck:     Musculoskeletal: Neck supple.  Cardiovascular:     Rate and Rhythm: Normal rate and regular rhythm.     Heart sounds: Normal heart sounds. No murmur.     Comments: 2+ DP/PT/radial pulses bilaterallty Pulmonary:     Effort: Pulmonary effort is normal. No respiratory distress.     Breath sounds: Normal breath sounds.  Abdominal:     General: Abdomen is flat. Bowel sounds are normal. There is no distension.     Palpations: Abdomen is soft.     Tenderness: There is no abdominal tenderness. There is no guarding or rebound.     Hernia: No hernia is present.  Skin:    General: Skin is warm and dry.  Neurological:     General: No focal deficit present.     Mental Status: She is alert and oriented to person, place, and time.     Cranial Nerves: No cranial nerve deficit.  Psychiatric:        Mood and Affect: Mood normal. Mood is not anxious.        Behavior: Behavior normal.      ED Treatments / Results  Labs (all labs ordered are listed, but only abnormal results are displayed) Labs Reviewed  URINALYSIS, ROUTINE W REFLEX MICROSCOPIC - Abnormal; Notable for the following components:      Result Value   Glucose, UA 100 (*)    All other components within normal limits  COMPREHENSIVE METABOLIC PANEL - Abnormal; Notable for the following components:   Glucose, Bld 108 (*)    Calcium 8.8 (*)    All other components within normal limits  PREGNANCY, URINE  CBC WITH  DIFFERENTIAL/PLATELET  LIPASE, BLOOD  TROPONIN I    EKG EKG Interpretation  Date/Time:  Friday Oct 16 2018 20:00:58 EDT Ventricular Rate:  61 PR Interval:  160 QRS Duration: 84 QT Interval:  380 QTC Calculation: 382 R Axis:   74 Text  Interpretation:  Normal sinus rhythm Normal ECG agree. no old comparison Confirmed by Arby Barrette [phone]) on 10/16/2018 11:25:19 PM   Radiology Ct Angio Chest/abd/pel For Dissection W And/or W/wo  Result Date: 10/16/2018 CLINICAL DATA:  53 year old female with back pain. Concern for aortic dissection. EXAM: CT ANGIOGRAPHY CHEST, ABDOMEN AND PELVIS TECHNIQUE: Multidetector CT imaging through the chest, abdomen and pelvis was performed using the standard protocol during bolus administration of intravenous contrast. Multiplanar reconstructed images and MIPs were obtained and reviewed to evaluate the vascular anatomy. CONTRAST:  OMNIPAQUE IOHEXOL 350 MG/ML SOLN COMPARISON:  Chest radiograph dated 06/18/2013 FINDINGS: CTA CHEST FINDINGS Cardiovascular: There is no cardiomegaly or pericardial effusion. The thoracic aorta is unremarkable. The visualized origins of the great vessels of the aortic arch are patent. No pulmonary artery embolus identified. Mediastinum/Nodes: There is no hilar or mediastinal adenopathy. The esophagus and the thyroid gland are grossly unremarkable. No mediastinal fluid collection. Lungs/Pleura: Minimal left lung base linear atelectasis/scarring. The lungs are clear. There is no pleural effusion or pneumothorax. The central airways are patent. Musculoskeletal: No acute osseous pathology. Review of the MIP images confirms the above findings. CTA ABDOMEN AND PELVIS FINDINGS VASCULAR Aorta: Normal caliber aorta without aneurysm, dissection, vasculitis or significant stenosis. Celiac: Patent without evidence of aneurysm, dissection, vasculitis or significant stenosis. There is an accessory left hepatic artery from the left gastric artery. SMA:  Patent without evidence of aneurysm, dissection, vasculitis or significant stenosis. Renals: Both renal arteries are patent without evidence of aneurysm, dissection, vasculitis, fibromuscular dysplasia or significant stenosis. An accessory renal artery extending to the inferior pole of the right kidney noted. IMA: Patent without evidence of aneurysm, dissection, vasculitis or significant stenosis. Inflow: Patent without evidence of aneurysm, dissection, vasculitis or significant stenosis. Veins: No obvious venous abnormality within the limitations of this arterial phase study. Review of the MIP images confirms the above findings. NON-VASCULAR No intra-abdominal free air or free fluid. Hepatobiliary: No focal liver abnormality is seen. No gallstones, gallbladder wall thickening, or biliary dilatation. Pancreas: Unremarkable. No pancreatic ductal dilatation or surrounding inflammatory changes. Spleen: Normal in size without focal abnormality. Adrenals/Urinary Tract: Adrenal glands are unremarkable. Kidneys are normal, without renal calculi, focal lesion, or hydronephrosis. Bladder is unremarkable. Stomach/Bowel: Stomach is within normal limits. Appendix appears normal. No evidence of bowel wall thickening, distention, or inflammatory changes. Lymphatic: No adenopathy. Reproductive: The uterus is anteverted. An intrauterine device is noted. There is a 4.5 cm right ovarian cyst. Evaluation of the pelvic structures is limited due to streak artifact caused by right hip arthroplasty. Other: None Musculoskeletal: Total right hip arthroplasty. No acute osseous pathology. Review of the MIP images confirms the above findings. IMPRESSION: 1. No acute intrathoracic, abdominal, or pelvic pathology. No aortic aneurysm or dissection. No CT evidence of pulmonary artery embolus. 2. A 4.5 cm right ovarian cyst. Electronically Signed   By: Elgie Collard M.D.   On: 10/16/2018 22:46    Procedures Procedures (including critical  care time)  Medications Ordered in ED Medications  iohexol (OMNIPAQUE) 350 MG/ML injection 100 mL (100 mLs Intravenous Contrast Given 10/16/18 2222)     Initial Impression / Assessment and Plan / ED Course  I have reviewed the triage vital signs and the nursing notes.  Pertinent labs & imaging results that were available during my care of the patient were reviewed by me and considered in my medical decision making (see chart for details).  Clinical Course as of Oct 17 6  Fri Oct 16, 2018  2217 Morphine was ordered, however patient does not have a ride home, therefore was discontinued.  I do not want to give patient p.o. intake at this time until scan is obtained.   [EH]    Clinical Course User Index [EH] Cristina Gong, PA-C      Patient presents today for evaluation of chest, abdominal and back pain radiating into her left arm.  She reports that for approximately 1 week she has been feeling like her abdomen is full and bloated.  She reports that she has bowel movements every day and they have been normal for her.  She does not have a history of abdominal surgery.  As her symptoms began in her abdomen, and then over the past 24 to 48 hours migrated up into her epigastric area, left upper chest, and into her back.  Based on the combination of abdominal pain progressed into chest pain with pain radiating into the back concern the patient may have dissection type pathology.  Given abdominal symptoms CT scan abdomen pelvis was ordered to evaluate for masses, obstruction, or other cause for her symptoms.  Based on the presence of chest pain, and back pain decision was made to obtain chest imaging in addition to abdomen pelvis.  Dissection study was performed which did not show any evidence of aneurysm or dissection, shows a right ovarian cyst.  On my review of images patient appears to have a moderate amount of stool in her colon.  No evidence of obstruction.  Her EKG was obtained without  evidence of ischemia or other significant abnormalities.  Troponin was not elevated.  Delta troponin was not ordered as she has had symptoms for over 24 hours consistently.    Her lipase is not elevated.  She does not have any significant electrolyte or hematologic derangements.  Her urine was significant for 100 glucose, she does not have a history of diabetes, her sugar is not elevated here and she does not take any medicine would cause glucosuria.  Recommended PCP follow-up.  With retained stool in the colon I recommended that she start taking MiraLAX.  We discussed that it may take 2 to 3 days for her to have a significant increase in her bowel movements.  This patient was discussed with Dr. Donnald Garre and Dr. Read Drivers.   Return precautions were discussed with patient who states their understanding.  At the time of discharge patient denied any unaddressed complaints or concerns.  Patient is agreeable for discharge home.   Final Clinical Impressions(s) / ED Diagnoses   Final diagnoses:  Atypical chest pain  Generalized abdominal pain  Cyst of right ovary  Acute left-sided thoracic back pain    ED Discharge Orders    None       Norman Clay 10/17/18 0008    Molpus, Jonny Ruiz, MD 10/17/18 [phone]

## 2018-10-26 ENCOUNTER — Other Ambulatory Visit: Payer: Self-pay

## 2018-10-26 ENCOUNTER — Encounter: Payer: Self-pay | Admitting: Obstetrics & Gynecology

## 2018-10-26 ENCOUNTER — Ambulatory Visit (INDEPENDENT_AMBULATORY_CARE_PROVIDER_SITE_OTHER): Payer: Managed Care, Other (non HMO) | Admitting: Obstetrics & Gynecology

## 2018-10-26 VITALS — BP 120/68 | HR 64 | Temp 98.0°F | Ht 64.0 in | Wt 141.0 lb

## 2018-10-26 DIAGNOSIS — N83201 Unspecified ovarian cyst, right side: Secondary | ICD-10-CM | POA: Diagnosis not present

## 2018-10-26 DIAGNOSIS — N926 Irregular menstruation, unspecified: Secondary | ICD-10-CM

## 2018-10-26 LAB — POCT URINE PREGNANCY: Preg Test, Ur: NEGATIVE

## 2018-10-26 NOTE — Progress Notes (Signed)
GYNECOLOGY  VISIT  CC:   Irregular bleeding with IUD  HPI: 53 y.o. 542P2002 Divorced White or Caucasian female here for increased monthly spotting with IUD.  Reports this started over the past few months.  It seems regular and like a cycle.  She was seen in March for AEX and this has started since that time.  IUD due for removal and replacement in October.  D/w pt typical bleeding that occurs as time is nearing for replacement.  Reports having issues with bloating and abdominal discomfort last week.  She also had associated chest and left arm pain.  Saw PCP and have evaluation including EKG, blood work and CT scan.  Pt did have 4.5cm cyst on right ovary with CT.  Reviewed with pt.  Likely this is source of pain and abdominal distension which has improved.  Denies vaginal discharge.  Not SA so has no painful intercourse.  Denies fever.  CT and lab work reviewed with pt.  GYNECOLOGIC HISTORY: Patient's last menstrual period was 09/13/2018 (approximate). Contraception: IUD Mirena placed 03/16/14  Menopausal hormone therapy: none  Patient Active Problem List   Diagnosis Date Noted  . Raynaud phenomenon 09/05/2017  . OA (osteoarthritis) of hip 10/02/2016  . Fecal incontinence 01/18/2014  . Peripheral sensory neuropathy 01/18/2014  . Menorrhagia with regular cycle 01/18/2014  . Numbness in left leg 03/26/2013    Past Medical History:  Diagnosis Date  . Anemia    Mild and was told she had low B12 in the past  . Ear ache   . Environmental exposure    To grass  . Influenza 06/2017  . Numbness in left leg 03/26/2013  . Raynaud's phenomenon   . [place] spotted fever 01/2017   due to tick bite     Past Surgical History:  Procedure Laterality Date  . bladder dilation  about 7 years ago  . COLONOSCOPY W/ POLYPECTOMY  2014   x3  . TONSILLECTOMY AND ADENOIDECTOMY    . TOTAL HIP ARTHROPLASTY Right 10/02/2016   Procedure: RIGHT TOTAL HIP ARTHROPLASTY ANTERIOR APPROACH;  Surgeon:  Ollen GrossFrank Aluisio, MD;  Location: [facility] OR[facility];  Service: Orthopedics;  Laterality: Right;    MEDS:   Current Outpatient Medications on File Prior to Visit  Medication Sig Dispense Refill  . Polyethylene Glycol 3350 (MIRALAX PO) Take by mouth daily as needed.     No current facility-administered medications on file prior to visit.     ALLERGIES: Gluten meal  Family History  Problem Relation Age of Onset  . Fibromyalgia Mother   . Arthritis Father   . Hypertension Father   . Hypertension Brother   . Diabetes Maternal Grandfather   . Stroke Maternal Uncle     SH:  Divorced, non smoker  Review of Systems  Gastrointestinal: Positive for abdominal distention.  Genitourinary: Positive for flank pain and vaginal bleeding.  All other systems reviewed and are negative.   PHYSICAL EXAMINATION:    BP 120/68   Pulse 64   Temp 98 F (36.7 C) (Temporal)   Ht 5\' 4"  (1.626 m)   Wt 141 lb (64 kg)   LMP 09/13/2018 (Approximate)   BMI 24.20 kg/m     General appearance: alert, cooperative and appears stated age Abdomen: soft, non-tender; bowel sounds normal; no masses,  no organomegaly Lymph:  no inguinal LAD noted  Pelvic: External genitalia:  no lesions              Urethra:  normal appearing urethra with  no masses, tenderness or lesions              Bartholins and Skenes: normal                 Vagina: normal appearing vagina with normal color and discharge, no lesions              Cervix: no lesions              Bimanual Exam:  Uterus:  normal size, contour, position, consistency, mobility, non-tender              Adnexa: no mass, fullness, tenderness              Anus:  normal sphincter tone, no lesions  Chaperone was present for exam.  Assessment: 4.5cm ovarian cyst Regular spotting with IUD which is due for removal in October Recent abdominal distension and bloating  Plan: Pt is going to return for PUS to recheck cyst in six week. Will have IUD removed and replaced at that  time as she desires bleeding to stop and IUD removal due no later than Octber Knows to call with any resumption of symptoms.   ~25 minutes spent with patient >50% of time was in face to face discussion of above.

## 2018-10-27 ENCOUNTER — Telehealth: Payer: Self-pay | Admitting: *Deleted

## 2018-10-27 DIAGNOSIS — N926 Irregular menstruation, unspecified: Secondary | ICD-10-CM

## 2018-10-27 DIAGNOSIS — Z3009 Encounter for other general counseling and advice on contraception: Secondary | ICD-10-CM

## 2018-10-27 DIAGNOSIS — N83201 Unspecified ovarian cyst, right side: Secondary | ICD-10-CM

## 2018-10-27 NOTE — Telephone Encounter (Signed)
Spoke with patient. PUS and Mirena IUD exchange scheduled for 6/25 at 2:30pm, consult to follow at 3pm with Dr. Hyacinth Meeker. Advised to take Motrin 800 mg with food and water one hour before procedure. Orders placed for precert. Patient verbalizes understanding.   Routing to provider for final review. Patient is agreeable to disposition. Will close encounter.  Cc: Harland Dingwall

## 2018-10-27 NOTE — Telephone Encounter (Signed)
-----   Message from Jerene Bears, MD sent at 10/26/2018 12:02 PM EDT ----- Pt needs PUS and IUD removal and replacement in 6 weeks.  Sending to Alicia to call and schedule and Suzy for precert.  This is for contraception needs.  Thanks.

## 2018-11-20 ENCOUNTER — Ambulatory Visit: Payer: Managed Care, Other (non HMO) | Admitting: Obstetrics & Gynecology

## 2018-12-01 ENCOUNTER — Other Ambulatory Visit: Payer: Self-pay

## 2018-12-03 ENCOUNTER — Other Ambulatory Visit: Payer: Self-pay

## 2018-12-03 ENCOUNTER — Ambulatory Visit (INDEPENDENT_AMBULATORY_CARE_PROVIDER_SITE_OTHER): Payer: Managed Care, Other (non HMO)

## 2018-12-03 ENCOUNTER — Ambulatory Visit (INDEPENDENT_AMBULATORY_CARE_PROVIDER_SITE_OTHER): Payer: Managed Care, Other (non HMO) | Admitting: Obstetrics & Gynecology

## 2018-12-03 ENCOUNTER — Encounter: Payer: Self-pay | Admitting: Obstetrics & Gynecology

## 2018-12-03 VITALS — BP 100/60 | HR 64 | Temp 97.9°F | Ht 64.0 in | Wt 141.0 lb

## 2018-12-03 DIAGNOSIS — Z30433 Encounter for removal and reinsertion of intrauterine contraceptive device: Secondary | ICD-10-CM | POA: Diagnosis not present

## 2018-12-03 DIAGNOSIS — N926 Irregular menstruation, unspecified: Secondary | ICD-10-CM | POA: Diagnosis not present

## 2018-12-03 DIAGNOSIS — N83201 Unspecified ovarian cyst, right side: Secondary | ICD-10-CM

## 2018-12-03 DIAGNOSIS — Z3009 Encounter for other general counseling and advice on contraception: Secondary | ICD-10-CM | POA: Diagnosis not present

## 2018-12-03 NOTE — Patient Instructions (Signed)
IUD Post-procedure Instructions . Cramping is common.  You may take Ibuprofen, Aleve, or Tylenol for the cramping.  This should resolve within 24 hours.   . You may have a small amount of spotting.  You should wear a mini pad for the next few days. . You may have intercourse in 24 hours.  Use a back up method for 7 days for contraception. . You need to call the office if you have any pelvic pain, fever, heavy bleeding, or foul smelling vaginal discharge. . Shower or bathe as normal

## 2018-12-03 NOTE — Progress Notes (Signed)
53 y.o. 22P2002 Married Caucasian female presents for removal of  IUD and re-insertion of IUD.  She is planning on using Mirena IUD.  Pt has used this method in the past and has decided to continue with it at this time.  Pt has also been counseled about risks and benefits as well as complications.  Consent is obtained today.  All questions answered prior to start of procedure.   She Is also here for follow ultrasound after having CT of abdomen showing a 4.5cm cyst on 10/16/2018.  Abdominal symptoms have all resolved.    Uterus:  8.5 x 5.0 x 3.7cm Endometrium:  2.709mm with IUD in correct location Left ovary:  3.0 x 2.7 x 2.6cm Right ovary:  2.7 x 2.5 x 1.9cm with 1.4 x 1.2cm follicle Cul de sac:  No free fluid  Images reviewed with pt.  Prior cyst has resolved.  IUD is in correct location.    Recent STD testing:  Not indicated as she is not SA. LMP:  No LMP recorded. (Menstrual status: IUD).  Patient Active Problem List   Diagnosis Date Noted  . Raynaud phenomenon 09/05/2017  . OA (osteoarthritis) of hip 10/02/2016  . Fecal incontinence 01/18/2014  . Peripheral sensory neuropathy 01/18/2014  . Menorrhagia with regular cycle 01/18/2014  . Numbness in left leg 03/26/2013   Past Medical History:  Diagnosis Date  . Anemia    Mild and was told she had low B12 in the past  . Ear ache   . Environmental exposure    To grass  . Influenza 06/2017  . Numbness in left leg 03/26/2013  . Raynaud's phenomenon   . [place] spotted fever 01/2017   due to tick bite    Current Outpatient Medications on File Prior to Visit  Medication Sig Dispense Refill  . Polyethylene Glycol 3350 (MIRALAX PO) Take by mouth daily as needed.     No current facility-administered medications on file prior to visit.    Gluten meal  Review of Systems  All other systems reviewed and are negative.  Vitals:   12/03/18 1443  BP: 100/60  Pulse: 64  Temp: 97.9 F (36.6 C)  TempSrc: Temporal  Weight: 141 lb  (64 kg)  Height: 5\' 4"  (1.626 m)    Gen:  WN[facility] healthy female NAD Abdomen: soft, non-tender Groin:  no inguinal nodes palpated  Pelvic exam: Vulva:  normal female genitalia Vagina:  normal vagina Cervix:  Non-tender, Negative CMT, no lesions or redness. Uterus:  normal shape, position and consistency   Procedure:  Speculum reinserted.  Cervix visualized and cleansed with Betadine x 3.  Paracervical block was not placed.  Single toothed tenaculum applied to anterior lip of cervix without difficulty.    IUD string noted and grasped with ringed forcep.  With one pull, IUD removed easily.  Pt has some mild cramping but tolerated this well.  Then uterus sounded to 8cm.  Lot number: [id number].  Expiration:  11/2020.  IUD package was opened.  IUD and introducer passed to fundus and then withdrawn slightly before IUD was passed into endometrial cavity.  Introducer removed.  Strings cut to 2cm.  Tenaculum removed from cervix. Silver nitrate needed for hemostasis at tenaculum sites.  Pt tolerated the procedure well.  All instruments removed from vagina.  A: Removal of Mirena IUD and reinsertion of Mirena IUD Contraception desires H/o 4.5cm ovarian cyst that has resolved  P:  Return for recheck 6-8 weeks.  Will do this at her  AEX which is scheduled for 01/2019. Pt aware to call for any concerns Pt aware removal due no later than 12/03/2023.  IUD card given to pt.

## 2019-01-11 ENCOUNTER — Encounter: Payer: Self-pay | Admitting: Obstetrics & Gynecology

## 2019-01-11 ENCOUNTER — Ambulatory Visit (INDEPENDENT_AMBULATORY_CARE_PROVIDER_SITE_OTHER): Payer: BC Managed Care – PPO | Admitting: Obstetrics & Gynecology

## 2019-01-11 ENCOUNTER — Other Ambulatory Visit: Payer: Self-pay

## 2019-01-11 VITALS — BP 96/60 | HR 68 | Temp 97.6°F | Ht 64.0 in | Wt 141.0 lb

## 2019-01-11 DIAGNOSIS — Z124 Encounter for screening for malignant neoplasm of cervix: Secondary | ICD-10-CM

## 2019-01-11 DIAGNOSIS — Z01419 Encounter for gynecological examination (general) (routine) without abnormal findings: Secondary | ICD-10-CM

## 2019-01-11 DIAGNOSIS — R87610 Atypical squamous cells of undetermined significance on cytologic smear of cervix (ASC-US): Secondary | ICD-10-CM | POA: Diagnosis not present

## 2019-01-11 NOTE — Progress Notes (Signed)
53 y.o. G23P2002 Divorced White or Caucasian female here for annual exam.  Had IUD replaced in June.  Had a little bit of spotting after placement but none since.  Denies cramping as well.  Reports last two years have been crazy.  Lost job last week.  Started working at the place in December.  Just had issues with her boss.    Patient's last menstrual period was 09/13/2018.          Sexually active: No.  The current method of family planning is IUD. Mirena placed 12/03/18 Exercising: Yes.    HIT class Smoker:  no  Health Maintenance: Pap:  05/24/16 ASCUS. HR HPV:Neg   02/07/15 Neg  History of abnormal Pap:  yes MMG:  01/27/17 [place] Right BIRADS2:Benign. f/u 1 year  Colonoscopy:  11/04/18 f/u 7 years  BMD:   Never TDaP:  2019 Pneumonia vaccine(s):  n/a Shingrix:   No Hep C testing: n/a Screening Labs: Plan fasting lab work in 2021   reports that she has never smoked. She has never used smokeless tobacco. She reports that she does not drink alcohol or use drugs.  Past Medical History:  Diagnosis Date  . Anemia    Mild and was told she had low B12 in the past  . Ear ache   . Environmental exposure    To grass  . Influenza 06/2017  . Numbness in left leg 03/26/2013  . Raynaud's phenomenon   . Rocky Mountain spotted fever 01/2017   due to tick bite     Past Surgical History:  Procedure Laterality Date  . bladder dilation  about 7 years ago  . COLONOSCOPY W/ POLYPECTOMY  2014   x3  . TONSILLECTOMY AND ADENOIDECTOMY    . TOTAL HIP ARTHROPLASTY Right 10/02/2016   Procedure: RIGHT TOTAL HIP ARTHROPLASTY ANTERIOR APPROACH;  Surgeon: Gaynelle Arabian, MD;  Location: [facility];  Service: Orthopedics;  Laterality: Right;    No current outpatient medications on file.   No current facility-administered medications for this visit.     Family History  Problem Relation Age of Onset  . Fibromyalgia Mother   . Arthritis Father   . Hypertension Father   . Hypertension Brother   . Diabetes  Maternal Grandfather   . Stroke Maternal Uncle     Review of Systems  All other systems reviewed and are negative.   Exam:   BP 96/60   Pulse 68   Temp 97.6 F (36.4 C) (Temporal)   Ht 5\' 4"  (1.626 m)   Wt 141 lb (64 kg)   LMP 09/13/2018   BMI 24.20 kg/m     Height: 5\' 4"  (162.6 cm)  Ht Readings from Last 3 Encounters:  01/11/19 5\' 4"  (1.626 m)  12/03/18 5\' 4"  (1.626 m)  10/26/18 5\' 4"  (1.626 m)    General appearance: alert, cooperative and appears stated age Head: Normocephalic, without obvious abnormality, atraumatic Neck: no adenopathy, supple, symmetrical, trachea midline and thyroid normal to inspection and palpation Lungs: clear to auscultation bilaterally Breasts: normal appearance, no masses or tenderness Heart: regular rate and rhythm Abdomen: soft, non-tender; bowel sounds normal; no masses,  no organomegaly Extremities: extremities normal, atraumatic, no cyanosis or edema Skin: Skin color, texture, turgor normal. No rashes or lesions Lymph nodes: Cervical, supraclavicular, and axillary nodes normal. No abnormal inguinal nodes palpated Neurologic: Grossly normal   Pelvic: External genitalia:  no lesions              Urethra:  normal  appearing urethra with no masses, tenderness or lesions             Vagina: normal appearing vagina with nor              Bartholins and Skenes: normal    mal color and discharge, no lesions              Cervix: no lesions, 2cm IUD string noted              Pap taken: Yes.   Bimanual Exam:  Uterus:  normal size, contour, position, consistency, mobility, non-tender              Adnexa: normal adnexa and no mass, fullness, tenderness               Rectovaginal: Confirms               Anus:  normal sphincter tone, no lesions  Chaperone was present for exam.  A:  Well Woman with normal exam H/o menorrhagia, has second Mirena IUD placed 11/2018 H/O ASCUS pap with neg HR HPV 2017 H/O cervical poly H/O colon polyps  P:    Mammogram guidelines reviewed.  Pt aware this is due.  She will call to schedule this. pap smear and HR HPV obtained today.  Will make sure this is covered due to pt's concerns about cost Plan lab work next year, fasting Colonoscopy is UTD Plan BMD around age 53 Return annually or prn

## 2019-01-15 ENCOUNTER — Other Ambulatory Visit (HOSPITAL_COMMUNITY)
Admission: RE | Admit: 2019-01-15 | Discharge: 2019-01-15 | Disposition: A | Discharge status: Home / Self Care | Payer: BC Managed Care – PPO | Source: Ambulatory Visit | Attending: Obstetrics & Gynecology | Admitting: Obstetrics & Gynecology

## 2019-01-15 ENCOUNTER — Other Ambulatory Visit: Payer: Self-pay | Admitting: Obstetrics & Gynecology

## 2019-01-15 DIAGNOSIS — Z124 Encounter for screening for malignant neoplasm of cervix: Secondary | ICD-10-CM | POA: Diagnosis not present

## 2019-01-15 NOTE — Progress Notes (Signed)
Pap order placed. 

## 2019-01-19 ENCOUNTER — Ambulatory Visit: Payer: Managed Care, Other (non HMO) | Admitting: Obstetrics & Gynecology

## 2019-01-19 LAB — CYTOLOGY - PAP: Diagnosis: NEGATIVE

## 2019-02-07 ENCOUNTER — Emergency Department (HOSPITAL_COMMUNITY)
Admission: EM | Admit: 2019-02-07 | Discharge: 2019-02-08 | Disposition: A | Discharge status: Home / Self Care | Payer: BC Managed Care – PPO | Attending: Emergency Medicine | Admitting: Emergency Medicine

## 2019-02-07 ENCOUNTER — Emergency Department (HOSPITAL_COMMUNITY): Payer: BC Managed Care – PPO

## 2019-02-07 ENCOUNTER — Other Ambulatory Visit: Payer: Self-pay

## 2019-02-07 ENCOUNTER — Encounter (HOSPITAL_COMMUNITY): Payer: Self-pay | Admitting: Emergency Medicine

## 2019-02-07 DIAGNOSIS — R079 Chest pain, unspecified: Secondary | ICD-10-CM | POA: Diagnosis not present

## 2019-02-07 LAB — CBC
HCT: 38.9 % (ref 36.0–46.0)
Hemoglobin: 12.9 g/dL (ref 12.0–15.0)
MCH: 31.2 pg (ref 26.0–34.0)
MCHC: 33.2 g/dL (ref 30.0–36.0)
MCV: 94 fL (ref 80.0–100.0)
Platelets: 229 10*3/uL (ref 150–400)
RBC: 4.14 MIL/uL (ref 3.87–5.11)
RDW: 11.7 % (ref 11.5–15.5)
WBC: 10.3 10*3/uL (ref 4.0–10.5)
nRBC: 0 % (ref 0.0–0.2)

## 2019-02-07 LAB — BASIC METABOLIC PANEL
Anion gap: 10 (ref 5–15)
BUN: 12 mg/dL (ref 6–20)
CO2: 24 mmol/L (ref 22–32)
Calcium: 8.9 mg/dL (ref 8.9–10.3)
Chloride: 102 mmol/L (ref 98–111)
Creatinine, Ser: 0.78 mg/dL (ref 0.44–1.00)
GFR calc Af Amer: >60 mL/min (ref >60)
GFR calc non Af Amer: >60 mL/min (ref >60)
Glucose, Bld: 120 mg/dL — ABNORMAL HIGH (ref 70–99)
Potassium: 3.3 mmol/L — ABNORMAL LOW (ref 3.5–5.1)
Sodium: 136 mmol/L (ref 135–145)

## 2019-02-07 LAB — TROPONIN I (HIGH SENSITIVITY): Troponin I (High Sensitivity): 4 ng/L (ref <18)

## 2019-02-07 MED ORDER — SODIUM CHLORIDE 0.9% FLUSH: 3.0000 mL | Freq: Once | INTRAVENOUS | Status: DC

## 2019-02-07 NOTE — ED Triage Notes (Addendum)
Pt presents to ED POV c/o chest pain. Pt states she is having chest pain that radiated into her neck and arm since last night. Worsens w/ movement. Has been taking antiacids that provide some relief but does not completely alleviate.

## 2019-02-08 LAB — TROPONIN I (HIGH SENSITIVITY): Troponin I (High Sensitivity): 3 ng/L (ref <18)

## 2019-02-08 LAB — I-STAT BETA HCG BLOOD, ED (MC, WL, AP ONLY): I-stat hCG, quantitative: 6 m[IU]/mL — ABNORMAL HIGH (ref <5)

## 2019-02-08 MED ORDER — ALUM & MAG HYDROXIDE-SIMETH 200-200-20 MG/5ML PO SUSP
30.0000 mL | Freq: Once | ORAL | Status: AC
  Administered 2019-02-08: 30 mL via ORAL
  Filled 2019-02-08: qty 30

## 2019-02-08 MED ORDER — OMEPRAZOLE 20 MG PO CPDR: 20.0000 mg | DELAYED_RELEASE_CAPSULE | Freq: Every day | ORAL | 0 refills | Status: DC

## 2019-02-08 MED ORDER — ACETAMINOPHEN 325 MG PO TABS
650.0000 mg | ORAL_TABLET | Freq: Once | ORAL | Status: AC
  Administered 2019-02-08: 04:00:00 650 mg via ORAL
  Filled 2019-02-08: qty 2

## 2019-02-08 MED ORDER — LIDOCAINE VISCOUS HCL 2 % MT SOLN
15.0000 mL | Freq: Once | OROMUCOSAL | Status: AC
  Administered 2019-02-08: 15 mL via ORAL
  Filled 2019-02-08: qty 15

## 2019-02-08 NOTE — ED Provider Notes (Signed)
[facility] Provider Note   CSN: [id number] Arrival date & time: 02/07/19  2243     History   Chief Complaint Chief Complaint  Patient presents with  . Chest Pain    HPI Ashley Mcclain is a 53 y.o. female.     Patient to ED with constant chest pain radiating to R>L upper back that started 24 hours ago. No cough, congestion, SOB, fever or pleuritic pain. No nausea or vomiting. She feels symptoms more when lying down but there is no worsening pain with exertion.  She has tried taking antacids to relieve the pain that have been partially effective. No increased belching. No abdominal pain or distention.   The history is provided by the patient. No language interpreter was used.  Chest Pain   Past Medical History:  Diagnosis Date  . Anemia    Mild and was told she had low B12 in the past  . Ear ache   . Environmental exposure    To grass  . Influenza 06/2017  . Numbness in left leg 03/26/2013  . Raynaud's phenomenon   . Rocky [place] spotted fever 01/2017   due to tick bite     Patient Active Problem List   Diagnosis Date Noted  . Raynaud phenomenon 09/05/2017  . OA (osteoarthritis) of hip 10/02/2016  . Fecal incontinence 01/18/2014  . Peripheral sensory neuropathy 01/18/2014  . Menorrhagia with regular cycle 01/18/2014  . Numbness in left leg 03/26/2013    Past Surgical History:  Procedure Laterality Date  . bladder dilation  about 7 years ago  . COLONOSCOPY W/ POLYPECTOMY  2014   x3  . TONSILLECTOMY AND ADENOIDECTOMY    . TOTAL HIP ARTHROPLASTY Right 10/02/2016   Procedure: RIGHT TOTAL HIP ARTHROPLASTY ANTERIOR APPROACH;  Surgeon: Ollen GrossFrank Aluisio, MD;  Location: [facility];  Service: Orthopedics;  Laterality: Right;     OB History    Gravida  2   Para  2   Term  2   Preterm      AB      Living  2     SAB      TAB      Ectopic      Multiple      Live Births  2            Home Medications    Prior  to Admission medications   Not on File    Family History Family History  Problem Relation Age of Onset  . Fibromyalgia Mother   . Arthritis Father   . Hypertension Father   . Hypertension Brother   . Diabetes Maternal Grandfather   . Stroke Maternal Uncle     Social History Social History   Tobacco Use  . Smoking status: Never Smoker  . Smokeless tobacco: Never Used  Substance Use Topics  . Alcohol use: No    Alcohol/week: 0.0 standard drinks  . Drug use: No     Allergies   Gluten meal   Review of Systems Review of Systems  Cardiovascular: Positive for chest pain.     Physical Exam Updated Vital Signs BP 116/71   Pulse 67   Temp 98.7 F (37.1 C) (Oral)   Resp 13   Ht 5\' 4"  (1.626 m)   Wt 61.2 kg   SpO2 100%   BMI 23.17 kg/m   Physical Exam Vitals signs and nursing note reviewed.  Constitutional:      Appearance: She is  well-developed.  HENT:     Head: Normocephalic.  Neck:     Musculoskeletal: Normal range of motion and neck supple.  Cardiovascular:     Rate and Rhythm: Normal rate and regular rhythm.     Heart sounds: No murmur.  Pulmonary:     Effort: Pulmonary effort is normal.     Breath sounds: Normal breath sounds. No wheezing, rhonchi or rales.  Abdominal:     General: Bowel sounds are normal.     Palpations: Abdomen is soft.     Tenderness: There is no abdominal tenderness. There is no guarding or rebound.  Musculoskeletal: Normal range of motion.  Skin:    General: Skin is warm and dry.  Neurological:     Mental Status: She is alert and oriented to person, place, and time.      ED Treatments / Results  Labs (all labs ordered are listed, but only abnormal results are displayed) Labs Reviewed  BASIC METABOLIC PANEL - Abnormal; Notable for the following components:      Result Value   Potassium 3.3 (*)    Glucose, Bld 120 (*)    All other components within normal limits  I-STAT BETA HCG BLOOD, ED ([facility], [facility], [facility] ONLY) -  Abnormal; Notable for the following components:   I-stat hCG, quantitative 6.0 (*)    All other components within normal limits  CBC  TROPONIN I (HIGH SENSITIVITY)  TROPONIN I (HIGH SENSITIVITY)    EKG EKG Interpretation  Date/Time:  Sunday February 07 2019 22:48:44 EDT Ventricular Rate:  87 PR Interval:  170 QRS Duration: 86 QT Interval:  356 QTC Calculation: 428 R Axis:   66 Text Interpretation:    Normal sinus rhythm Normal ECG No significant change since last tracing Confirmed by Zadie Rhine ([id number]) on 02/08/2019 2:07:00 AM   Radiology Dg Chest 2 View  Result Date: 02/07/2019 CLINICAL DATA:  Chest pain. EXAM: CHEST - 2 VIEW COMPARISON:  CT angiography 10/16/2018 FINDINGS: The cardiomediastinal contours are normal. The lungs are clear. Pulmonary vasculature is normal. No consolidation, pleural effusion, or pneumothorax. No acute osseous abnormalities are seen. IMPRESSION: No acute chest findings. Electronically Signed   By: Narda Rutherford M.D.   On: 02/07/2019 23:53    Procedures Procedures (including critical care time)  Medications Ordered in ED Medications  sodium chloride flush (NS) 0.9 % injection 3 mL (has no administration in time range)  alum & mag hydroxide-simeth (MAALOX/MYLANTA) 200-200-20 MG/5ML suspension 30 mL (30 mLs Oral Given 02/08/19 0237)    And  lidocaine (XYLOCAINE) 2 % viscous mouth solution 15 mL (15 mLs Oral Given 02/08/19 0237)     Initial Impression / Assessment and Plan / ED Course  I have reviewed the triage vital signs and the nursing notes.  Pertinent labs & imaging results that were available during my care of the patient were reviewed by me and considered in my medical decision making (see chart for details).        Patient to ED with 24 hours of constant chest discomfort that radiates to back, some better with antacids. No SOB, cough, fever, nausea.   The patient has a Heart Score of 1 for age. Troponin x 2 negative. CXR clear,  other labs essentially normal.  GI cocktail provided with some improvement. Doubt ACS.   The patient is seen by Dr. Bebe Shaggy and is found appropriate for discharge home. Will encourage PCP follow up.  Final Clinical Impressions(s) / ED Diagnoses   Final diagnoses:  None   1. Nonspecific chest pain  ED Discharge Orders    None       Charlann Lange, PA-C 02/08/19 0411    Ripley Fraise, MD 02/08/19 [phone]

## 2019-02-08 NOTE — Discharge Instructions (Addendum)
Call your doctor later this morning to schedule a recheck appointment for later this week.   Return to the emergency department with any worsening symptoms or new concerns.

## 2020-02-03 ENCOUNTER — Other Ambulatory Visit: Payer: BC Managed Care – PPO

## 2020-03-20 NOTE — Progress Notes (Signed)
54 y.o. G98P2002 Divorced White or Caucasian female here for annual exam.  Doing well.  Denies vaginal bleeding.  Had Mirena IUD for contraception/bleeding control.    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    Mirena replaced 11/2018.  Aware this has a 7 year indication now.  Denies vaginal bleeding.    No LMP recorded. (Menstrual status: IUD).          Sexually active: No.  The current method of family planning is IUD.    Exercising: Yes.    3-4 days a week Smoker:  no  Health Maintenance: Pap:  05-24-16 ASCUS HPV HR neg, 01-15-2019 neg History of abnormal Pap:  yes MMG:  8/18 bilateral & rt breast u/s category c density birads 2:neg Colonoscopy:  11-04-2018 f/u yrs BMD:   none TDaP:  2019 Pneumonia vaccine(s):  no Shingrix:   no Hep C testing: no Screening Labs: discuss if needed   reports that she has never smoked. She has never used smokeless tobacco. She reports that she does not drink alcohol and does not use drugs.  Past Medical History:  Diagnosis Date  . Anemia    Mild and was told she had low B12 in the past  . Ear ache   . Environmental exposure    To grass  . Influenza 06/2017  . Numbness in left leg 03/26/2013  . Raynaud's phenomenon   . Rocky Mountain spotted fever 01/2017   due to tick bite     Past Surgical History:  Procedure Laterality Date  . bladder dilation  about 7 years ago  . COLONOSCOPY W/ POLYPECTOMY  2014   x3  . TONSILLECTOMY AND ADENOIDECTOMY    . TOTAL HIP ARTHROPLASTY Right 10/02/2016   Procedure: RIGHT TOTAL HIP ARTHROPLASTY ANTERIOR APPROACH;  Surgeon: Ollen Gross, MD;  Location: [facility];  Service: Orthopedics;  Laterality: Right;    Current Outpatient Medications  Medication Sig Dispense Refill  . Ascorbic Acid (VITAMIN C) 100 MG tablet Take 100 mg by mouth daily.    Marland Kitchen levonorgestrel (MIRENA) 20 MCG/24HR IUD 1 each by Intrauterine route once.    Marland Kitchen VITAMIN D PO Take by mouth.     No current  facility-administered medications for this visit.    Family History  Problem Relation Age of Onset  . Fibromyalgia Mother   . Arthritis Father   . Hypertension Father   . Hypertension Brother   . Diabetes Maternal Grandfather   . Stroke Maternal Uncle     Review of Systems  All other systems reviewed and are negative.   Exam:   BP 102/60 (BP Location: Right Arm, Patient Position: Sitting, Cuff Size: Normal)   Pulse 72   Resp 14   Ht 5' 3.75" (1.619 m)   Wt 133 lb (60.3 kg)   BMI 23.01 kg/m   Height: 5' 3.75" (161.9 cm)  General appearance: alert, cooperative and appears stated age Head: Normocephalic, without obvious abnormality, atraumatic Neck: no adenopathy, supple, symmetrical, trachea midline and thyroid normal to inspection and palpation Lungs: clear to auscultation bilaterally Breasts: normal appearance, no masses or tenderness Heart: regular rate and rhythm Abdomen: soft, non-tender; bowel sounds normal; no masses,  no organomegaly Extremities: extremities normal, atraumatic, no cyanosis or edema Skin: Skin color, texture, turgor normal. No rashes or lesions Lymph nodes: Cervical, supraclavicular, and axillary nodes normal. No abnormal inguinal nodes palpated Neurologic: Grossly normal   Pelvic: External genitalia:  no lesions except for  1cm lower left labia inclusion cyst              Urethra:  normal appearing urethra with no masses, tenderness or lesions              Bartholins and Skenes: normal                 Vagina: normal appearing vagina with normal color and discharge, no lesions              Cervix: no lesions, IUD string noted              Pap taken: Yes.   Bimanual Exam:  Uterus:  normal size, contour, position, consistency, mobility, non-tender              Adnexa: normal adnexa and no mass, fullness, tenderness               Rectovaginal: Confirms               Anus:  normal sphincter tone, no lesions  Chaperone, Zenovia Jordan, CMA, was  present for exam.  A:  Well Woman with normal exam H/o menorrhagia now with amenorrhea with Mirena IUD replaced 11/2018 H/o ASCUS pap with neg HR HPV 2014, 2017 Left inferior inclusion cyst  P:   Mammogram guidelines reviewed.  Pt is very aware this is overdue.   pap smear with HR HPV obtained today Colonoscopy 2020 Lipids, TSH, and Vit D Vaccines discussed.  Has declined Covid vaccination.  Shingrix reviewed.   Return annually or prn

## 2020-03-23 ENCOUNTER — Other Ambulatory Visit (HOSPITAL_COMMUNITY)
Admission: RE | Admit: 2020-03-23 | Discharge: 2020-03-23 | Disposition: A | Discharge status: Home / Self Care | Payer: Self-pay | Source: Ambulatory Visit | Attending: Obstetrics & Gynecology | Admitting: Obstetrics & Gynecology

## 2020-03-23 ENCOUNTER — Ambulatory Visit: Payer: 59 | Admitting: Obstetrics & Gynecology

## 2020-03-23 ENCOUNTER — Encounter: Payer: Self-pay | Admitting: Obstetrics & Gynecology

## 2020-03-23 ENCOUNTER — Other Ambulatory Visit: Payer: Self-pay

## 2020-03-23 VITALS — BP 102/60 | HR 72 | Resp 14 | Ht 63.75 in | Wt 133.0 lb

## 2020-03-23 DIAGNOSIS — Z124 Encounter for screening for malignant neoplasm of cervix: Secondary | ICD-10-CM | POA: Insufficient documentation

## 2020-03-23 DIAGNOSIS — Z01419 Encounter for gynecological examination (general) (routine) without abnormal findings: Secondary | ICD-10-CM | POA: Diagnosis not present

## 2020-03-23 DIAGNOSIS — Z0000 Encounter for general adult medical examination without abnormal findings: Secondary | ICD-10-CM

## 2020-03-24 LAB — CYTOLOGY - PAP
Comment: NEGATIVE
Diagnosis: NEGATIVE
High risk HPV: NEGATIVE

## 2020-03-24 LAB — TSH: TSH: 3.44 u[IU]/mL (ref 0.450–4.500)

## 2020-03-24 LAB — VITAMIN D 25 HYDROXY (VIT D DEFICIENCY, FRACTURES): Vit D, 25-Hydroxy: 63.6 ng/mL (ref 30.0–100.0)

## 2020-03-24 LAB — LIPID PANEL
Chol/HDL Ratio: 2.4 ratio (ref 0.0–4.4)
Cholesterol, Total: 144 mg/dL (ref 100–199)
HDL: 60 mg/dL (ref >39)
LDL Chol Calc (NIH): 72 mg/dL (ref 0–99)
Triglycerides: 55 mg/dL (ref 0–149)
VLDL Cholesterol Cal: 12 mg/dL (ref 5–40)

## 2020-04-17 ENCOUNTER — Other Ambulatory Visit: Payer: Self-pay | Admitting: Obstetrics & Gynecology

## 2020-04-17 DIAGNOSIS — Z1231 Encounter for screening mammogram for malignant neoplasm of breast: Secondary | ICD-10-CM

## 2020-04-21 ENCOUNTER — Other Ambulatory Visit: Payer: Self-pay

## 2020-04-21 ENCOUNTER — Ambulatory Visit
Admission: RE | Admit: 2020-04-21 | Discharge: 2020-04-21 | Disposition: A | Discharge status: Home / Self Care | Payer: 59 | Source: Ambulatory Visit | Attending: Obstetrics & Gynecology | Admitting: Obstetrics & Gynecology

## 2020-04-21 DIAGNOSIS — Z1231 Encounter for screening mammogram for malignant neoplasm of breast: Secondary | ICD-10-CM

## 2020-07-02 IMAGING — CT CT ANGIO CHEST-ABD-PELV FOR DISSECTION W/ AND WO/W CM
2 of 9 series · 13 of 46 positions shown, 15 images · IV contrast (APPLIED)
Comparison: Chest radiograph dated 06/18/2013

CLINICAL DATA: 52-year-old female with back pain. Concern for
aortic dissection.

EXAM:
CT ANGIOGRAPHY CHEST, ABDOMEN AND PELVIS
TECHNIQUE: Multidetector CT imaging through the chest, abdomen and pelvis was
performed using the standard protocol during bolus administration of
intravenous contrast. Multiplanar reconstructed images and MIPs were
obtained and reviewed to evaluate the vascular anatomy.
CONTRAST:  100mL OMNIPAQUE IOHEXOL 350 MG/ML SOLN

[Series 6: axial arterial · axial · arterial · 0.67mm/px · z∈[+773,+1334]mm · 10 of 209 slices shown, 12 images]
[im 11/209  soft-tissue]
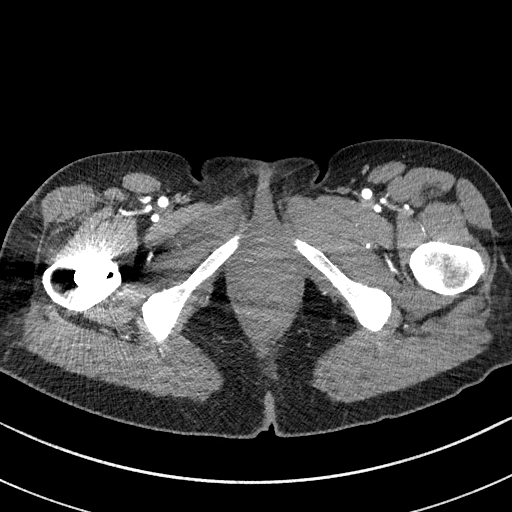
[im 11/209  bone]
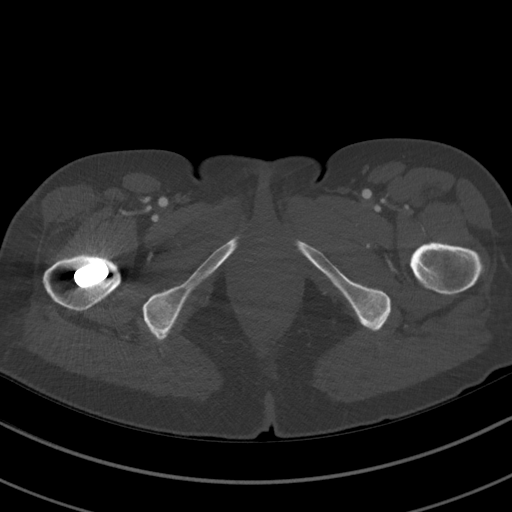
[im 33/209  soft-tissue]
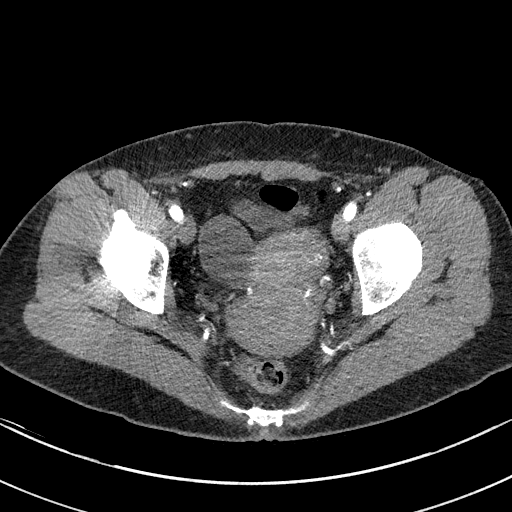
[im 55/209  soft-tissue]
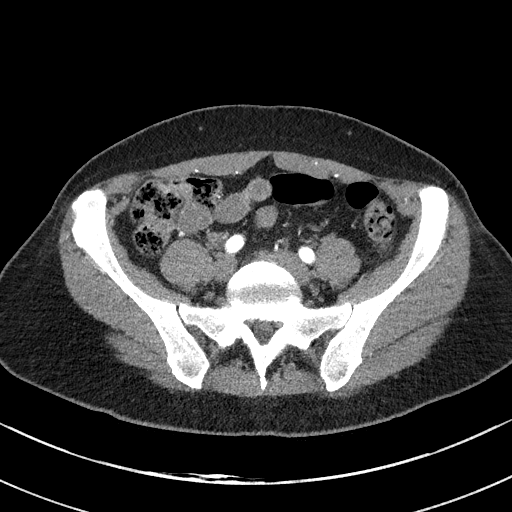
[im 77/209  soft-tissue]
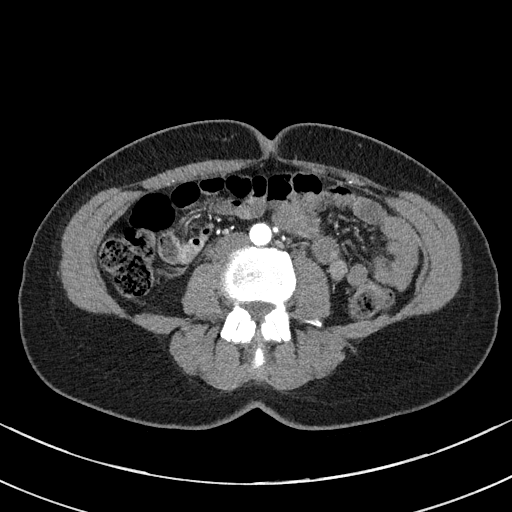
[im 99/209  soft-tissue]
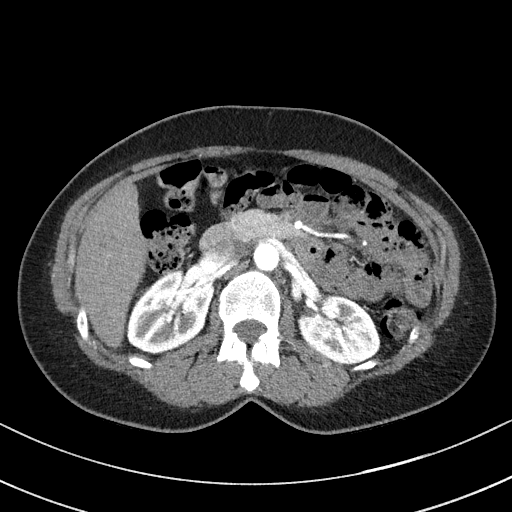
[im 110/209  soft-tissue]
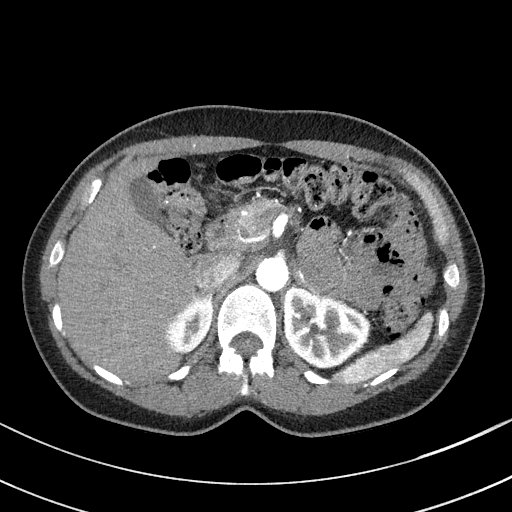
[im 132/209  soft-tissue]
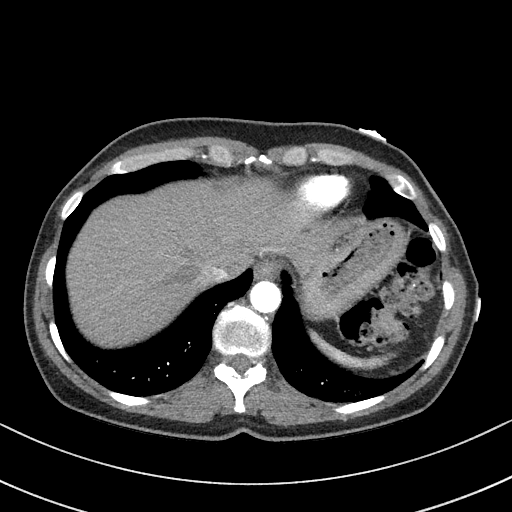
[im 154/209  soft-tissue]
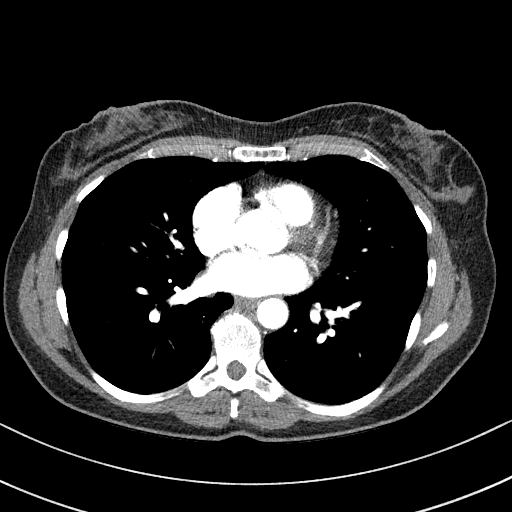
[im 176/209  soft-tissue]
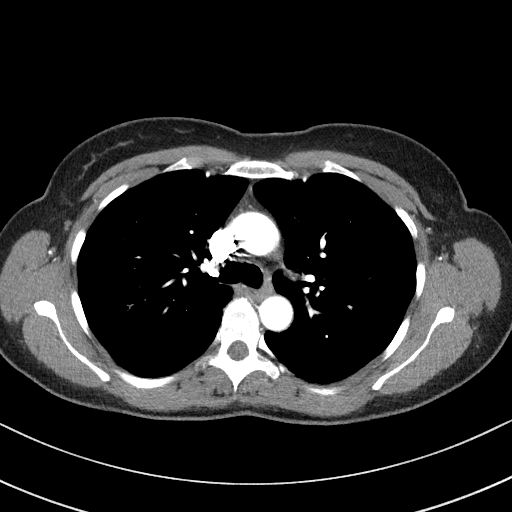
[im 176/209  bone]
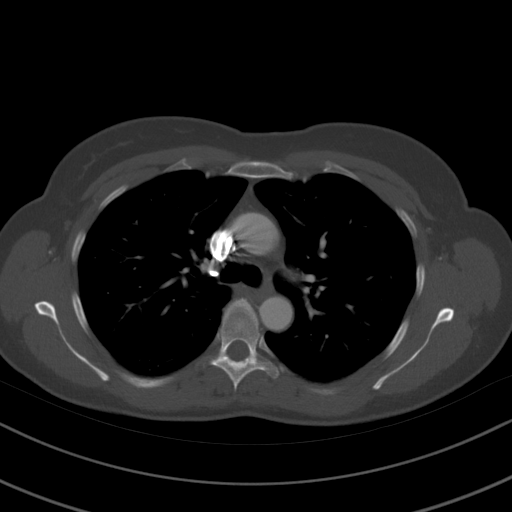
[im 198/209  soft-tissue]
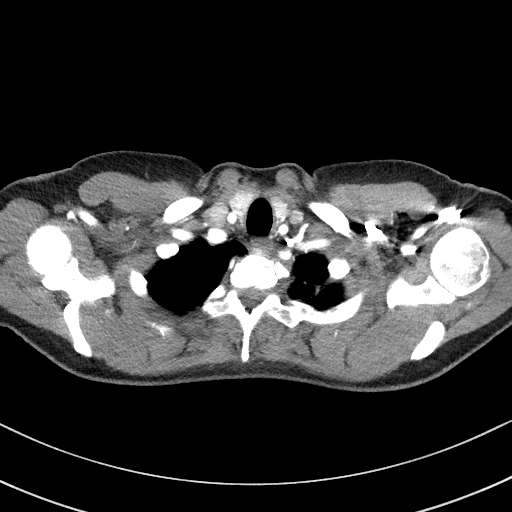

[Series 8: coronals · coronal · 0.73mm/px · 3 of 142 slices shown]
[im 36/142  soft-tissue]
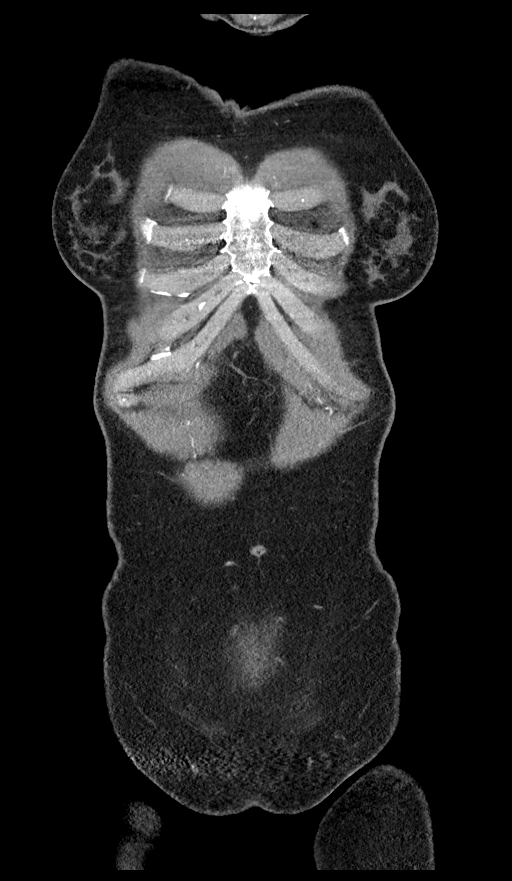
[im 71/142  soft-tissue]
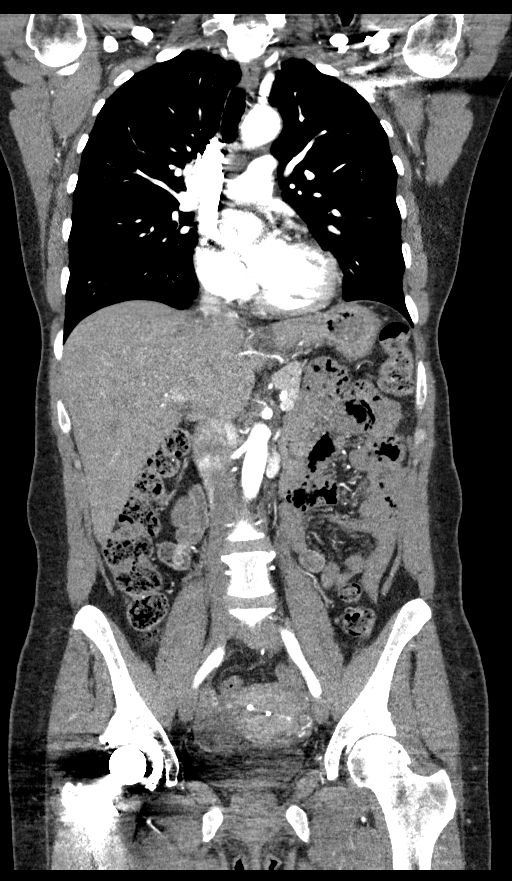
[im 106/142  soft-tissue]
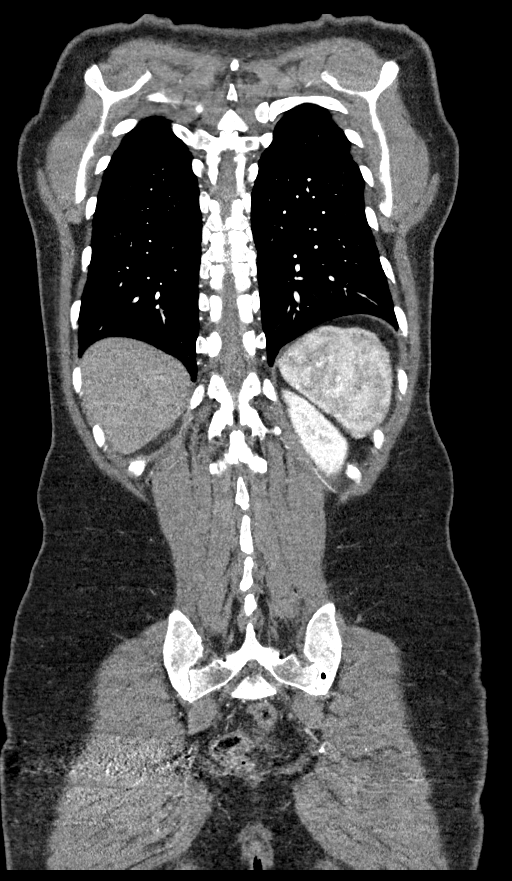

[13 of 46 positions shown; findings below may reference images not displayed]

FINDINGS: CTA CHEST FINDINGS

Cardiovascular: There is no cardiomegaly or pericardial effusion.
The thoracic aorta is unremarkable. The visualized origins of the
great vessels of the aortic arch are patent. No pulmonary artery
embolus identified.

Mediastinum/Nodes: There is no hilar or mediastinal adenopathy. The
esophagus and the thyroid gland are grossly unremarkable. No
mediastinal fluid collection.

Lungs/Pleura: Minimal left lung base linear atelectasis/scarring.
The lungs are clear. There is no pleural effusion or pneumothorax.
The central airways are patent.

Musculoskeletal: No acute osseous pathology.

Review of the MIP images confirms the above findings.

CTA ABDOMEN AND PELVIS FINDINGS

VASCULAR

Aorta: Normal caliber aorta without aneurysm, dissection, vasculitis
or significant stenosis.

Celiac: Patent without evidence of aneurysm, dissection, vasculitis
or significant stenosis. There is an accessory left hepatic artery
from the left gastric artery.

SMA: Patent without evidence of aneurysm, dissection, vasculitis or
significant stenosis.

Renals: Both renal arteries are patent without evidence of aneurysm,
dissection, vasculitis, fibromuscular dysplasia or significant
stenosis. An accessory renal artery extending to the inferior pole
of the right kidney noted.

IMA: Patent without evidence of aneurysm, dissection, vasculitis or
significant stenosis.

Inflow: Patent without evidence of aneurysm, dissection, vasculitis
or significant stenosis.

Veins: No obvious venous abnormality within the limitations of this
arterial phase study.

Review of the MIP images confirms the above findings.

NON-VASCULAR

No intra-abdominal free air or free fluid.

Hepatobiliary: No focal liver abnormality is seen. No gallstones,
gallbladder wall thickening, or biliary dilatation.

Pancreas: Unremarkable. No pancreatic ductal dilatation or
surrounding inflammatory changes.

Spleen: Normal in size without focal abnormality.

Adrenals/Urinary Tract: Adrenal glands are unremarkable. Kidneys are
normal, without renal calculi, focal lesion, or hydronephrosis.
Bladder is unremarkable.

Stomach/Bowel: Stomach is within normal limits. Appendix appears
normal. No evidence of bowel wall thickening, distention, or
inflammatory changes.

Lymphatic: No adenopathy.

Reproductive: The uterus is anteverted. An intrauterine device is
noted. There is a 4.5 cm right ovarian cyst. Evaluation of the
pelvic structures is limited due to streak artifact caused by right
hip arthroplasty.

Other: None

Musculoskeletal: Total right hip arthroplasty. No acute osseous
pathology.

Review of the MIP images confirms the above findings.
IMPRESSION: 1. No acute intrathoracic, abdominal, or pelvic pathology. No aortic
aneurysm or dissection. No CT evidence of pulmonary artery embolus.
2. A 4.5 cm right ovarian cyst.

## 2021-01-19 ENCOUNTER — Other Ambulatory Visit: Payer: Self-pay

## 2021-01-19 ENCOUNTER — Other Ambulatory Visit (HOSPITAL_BASED_OUTPATIENT_CLINIC_OR_DEPARTMENT_OTHER)
Admission: RE | Admit: 2021-01-19 | Discharge: 2021-01-19 | Disposition: A | Discharge status: Home / Self Care | Payer: Commercial Managed Care - PPO | Source: Ambulatory Visit | Attending: Obstetrics & Gynecology | Admitting: Obstetrics & Gynecology

## 2021-01-19 ENCOUNTER — Ambulatory Visit (HOSPITAL_BASED_OUTPATIENT_CLINIC_OR_DEPARTMENT_OTHER): Payer: Commercial Managed Care - PPO | Admitting: Obstetrics & Gynecology

## 2021-01-19 ENCOUNTER — Encounter (HOSPITAL_BASED_OUTPATIENT_CLINIC_OR_DEPARTMENT_OTHER): Payer: Self-pay

## 2021-01-19 ENCOUNTER — Other Ambulatory Visit (HOSPITAL_BASED_OUTPATIENT_CLINIC_OR_DEPARTMENT_OTHER): Payer: Self-pay

## 2021-01-19 ENCOUNTER — Encounter (HOSPITAL_BASED_OUTPATIENT_CLINIC_OR_DEPARTMENT_OTHER): Payer: Self-pay | Admitting: Obstetrics & Gynecology

## 2021-01-19 VITALS — BP 122/71 | HR 59 | Ht 64.0 in | Wt 136.2 lb

## 2021-01-19 DIAGNOSIS — R350 Frequency of micturition: Secondary | ICD-10-CM | POA: Diagnosis not present

## 2021-01-19 DIAGNOSIS — N3010 Interstitial cystitis (chronic) without hematuria: Secondary | ICD-10-CM

## 2021-01-19 DIAGNOSIS — R140 Abdominal distension (gaseous): Secondary | ICD-10-CM | POA: Diagnosis not present

## 2021-01-19 DIAGNOSIS — Z975 Presence of (intrauterine) contraceptive device: Secondary | ICD-10-CM | POA: Diagnosis not present

## 2021-01-19 DIAGNOSIS — Z8742 Personal history of other diseases of the female genital tract: Secondary | ICD-10-CM

## 2021-01-19 LAB — POCT URINALYSIS DIPSTICK
Bilirubin, UA: NEGATIVE
Blood, UA: NEGATIVE
Glucose, UA: NEGATIVE
Ketones, UA: NEGATIVE
Leukocytes, UA: NEGATIVE
Nitrite, UA: NEGATIVE
Protein, UA: NEGATIVE
Spec Grav, UA: 1.01 (ref 1.010–1.025)
Urobilinogen, UA: 0.2 E.U./dL
pH, UA: 7 (ref 5.0–8.0)

## 2021-01-19 MED ORDER — SULFAMETHOXAZOLE-TRIMETHOPRIM 800-160 MG PO TABS: 1.0000 | ORAL_TABLET | Freq: Two times a day (BID) | ORAL | 0 refills | Status: DC

## 2021-01-19 NOTE — Progress Notes (Signed)
GYNECOLOGY  VISIT  CC:   possible UTI  HPI: 55 y.o. G5P2002 Single White or Caucasian female here for complaint of pelvic pressure and some dysuria that started about about four weeks ago.  She did a teledoc visit with her work and she was treated with 5 day course of antibiotics.  This finished two weeks ago.  This did help some but symptoms have not fully resolved.  She has taken some AZO and this helped as well.  Does have hx of IC but typically controls this well with diet.  She has noted some associated low back pain.  Denies vaginal or vaginal discharge.    She does have a Mirena IUD that was placed 2020.  She reports some bleeding that feels like a cycle with some clotting.  Bleeding is 1-3 days but not heavy.  She had a Mirena in the past but had no bleeding with it so this is a change.  Last colonoscopy was 2020.  Had polyps.  7 year follow up.  Pt ihas restarted a probiotic.  Started this about a month ago.  This has helped bladder symptoms.      GYNECOLOGIC HISTORY: No LMP recorded. (Menstrual status: IUD). Contraception: IUD  Patient Active Problem List   Diagnosis Date Noted   Raynaud phenomenon 09/05/2017   OA (osteoarthritis) of hip 10/02/2016   Fecal incontinence 01/18/2014   Peripheral sensory neuropathy 01/18/2014   Menorrhagia with regular cycle 01/18/2014   Numbness in left leg 03/26/2013    Past Medical History:  Diagnosis Date   Anemia    Mild and was told she had low B12 in the past   Ear ache    Environmental exposure    To grass   Influenza 06/2017   Numbness in left leg 03/26/2013   Raynaud's phenomenon    [facility] spotted fever 01/2017   due to tick bite     Past Surgical History:  Procedure Laterality Date   bladder dilation  about 7 years ago   COLONOSCOPY W/ POLYPECTOMY  2014   x3   TONSILLECTOMY AND ADENOIDECTOMY     TOTAL HIP ARTHROPLASTY Right 10/02/2016   Procedure: RIGHT TOTAL HIP ARTHROPLASTY ANTERIOR APPROACH;  Surgeon: Ollen Gross, MD;  Location: [facility];  Service: Orthopedics;  Laterality: Right;    MEDS:   Current Outpatient Medications on File Prior to Visit  Medication Sig Dispense Refill   levonorgestrel (MIRENA) 20 MCG/24HR IUD 1 each by Intrauterine route once.     Probiotic Product (ALIGN) 4 MG CAPS Take by mouth.     Ascorbic Acid (VITAMIN C) 100 MG tablet Take 100 mg by mouth daily. (Patient not taking: Reported on 01/19/2021)     VITAMIN D PO Take by mouth. (Patient not taking: Reported on 01/19/2021)     No current facility-administered medications on file prior to visit.    ALLERGIES: Gluten meal  Family History  Problem Relation Age of Onset   Fibromyalgia Mother    Arthritis Father    Hypertension Father    Hypertension Brother    Diabetes Maternal Grandfather    Stroke Maternal Uncle     SH:  single, non smoker  Review of Systems  Constitutional: Negative.   Genitourinary:  Positive for frequency. Negative for dysuria and hematuria.   PHYSICAL EXAMINATION:    BP 122/71 (BP Location: Right Arm, Patient Position: Sitting, Cuff Size: Small)   Pulse (!) 59   Ht 5\' 4"  (1.626 m)   Wt  136 lb 3.2 oz (61.8 kg)   BMI 23.38 kg/m     General appearance: alert, cooperative and appears stated age Abd:  soft, mild distension, no masses, no organomegaly, non tender Lymph:  no inguinal LAD noted  Pelvic: External genitalia:  no lesions              Urethra:  normal appearing urethra with no masses, tenderness or lesions              Bartholins and Skenes: normal                 Vagina: normal appearing vagina with normal color and discharge, no lesions              Cervix: no lesions              Bimanual Exam:  Uterus:  normal size, contour, position, consistency, mobility, non-tender              Adnexa: no mass, fullness, tenderness  Chaperone, Ina Homes, CMA, was present for exam.  1. Frequency of urination - pt knows I don't think this is a UTI.  Culture pending.  As is  Friday, will treat with bactrim DS BID x 3 days.  Rx to pharmacy.  2. Interstitial cystitis - pt will send me names of medications previously prescribed.  If culture is negative, is going to need to see urology again.  Will likely need referral.  3. Bloating - advised pt to call Dr. Loreta Ave and get name of probiotic that she prescribes as I feel this is related to her IBS.  Pelvic exam is normal today.

## 2021-01-22 ENCOUNTER — Ambulatory Visit (HOSPITAL_BASED_OUTPATIENT_CLINIC_OR_DEPARTMENT_OTHER): Payer: 59 | Admitting: Obstetrics & Gynecology

## 2021-01-22 ENCOUNTER — Other Ambulatory Visit (HOSPITAL_BASED_OUTPATIENT_CLINIC_OR_DEPARTMENT_OTHER): Payer: Self-pay | Admitting: Obstetrics & Gynecology

## 2021-01-22 LAB — URINE CULTURE: Culture: >=100000 — AB

## 2021-01-22 MED ORDER — NITROFURANTOIN MONOHYD MACRO 100 MG PO CAPS: 100.0000 mg | ORAL_CAPSULE | Freq: Two times a day (BID) | ORAL | 0 refills | Status: DC

## 2021-04-30 ENCOUNTER — Other Ambulatory Visit: Payer: Self-pay

## 2021-04-30 ENCOUNTER — Ambulatory Visit (INDEPENDENT_AMBULATORY_CARE_PROVIDER_SITE_OTHER): Payer: Commercial Managed Care - PPO | Admitting: Obstetrics & Gynecology

## 2021-04-30 ENCOUNTER — Encounter (HOSPITAL_BASED_OUTPATIENT_CLINIC_OR_DEPARTMENT_OTHER): Payer: Self-pay | Admitting: Obstetrics & Gynecology

## 2021-04-30 VITALS — BP 120/75 | HR 64 | Ht 63.25 in | Wt 133.8 lb

## 2021-04-30 DIAGNOSIS — Z1231 Encounter for screening mammogram for malignant neoplasm of breast: Secondary | ICD-10-CM | POA: Diagnosis not present

## 2021-04-30 DIAGNOSIS — Z01419 Encounter for gynecological examination (general) (routine) without abnormal findings: Secondary | ICD-10-CM

## 2021-04-30 DIAGNOSIS — N926 Irregular menstruation, unspecified: Secondary | ICD-10-CM

## 2021-04-30 NOTE — Progress Notes (Signed)
55 y.o. [id number] Single White or Caucasian female here for annual exam.  Doing well.  Has had a couple of cycles this year.  Had one in October and September.  Has a Mirena IUD that was replaced 11/2018.  Not really having hot flashes but is warmer at night.  No LMP recorded. (Menstrual status: IUD).          Sexually active: No.  The current method of family planning is IUD.    Exercising: yes, dancing and hiking Smoker:  no  Health Maintenance: Pap:  03/23/20 neg History of abnormal Pap:  yes, two prior ASCUS pap with neg HR HPV MMG:  04/21/20 neg Colonoscopy:  11/04/18, follow up 7 years BMD:   not indicated Screening Labs: lipids, TSH and Vit D normal 03/2020   reports that she has never smoked. She has never used smokeless tobacco. She reports that she does not drink alcohol and does not use drugs.  Past Medical History:  Diagnosis Date   Anemia    Mild and was told she had low B12 in the past   Ear ache    Environmental exposure    To grass   Influenza 06/2017   Numbness in left leg 03/26/2013   Raynaud's phenomenon    [facility] spotted fever 01/2017   due to tick bite     Past Surgical History:  Procedure Laterality Date   bladder dilation  about 7 years ago   COLONOSCOPY W/ POLYPECTOMY  2014   x3   TONSILLECTOMY AND ADENOIDECTOMY     TOTAL HIP ARTHROPLASTY Right 10/02/2016   Procedure: RIGHT TOTAL HIP ARTHROPLASTY ANTERIOR APPROACH;  Surgeon: Ollen Gross, MD;  Location: [facility];  Service: Orthopedics;  Laterality: Right;    Current Outpatient Medications  Medication Sig Dispense Refill   levonorgestrel (MIRENA) 20 MCG/24HR IUD 1 each by Intrauterine route once.     Probiotic Product (ALIGN) 4 MG CAPS Take by mouth.     No current facility-administered medications for this visit.    Family History  Problem Relation Age of Onset   Fibromyalgia Mother    Arthritis Father    Hypertension Father    Hypertension Brother    Diabetes Maternal Grandfather     Stroke Maternal Uncle     Review of Systems  All other systems reviewed and are negative.  Exam:   BP 120/75   Pulse 64   Ht 5' 3.25" (1.607 m)   Wt 133 lb 12.8 oz (60.7 kg)   BMI 23.51 kg/m   Height: 5' 3.25" (160.7 cm)  General appearance: alert, cooperative and appears stated age Head: Normocephalic, without obvious abnormality, atraumatic Neck: no adenopathy, supple, symmetrical, trachea midline and thyroid normal to inspection and palpation Lungs: clear to auscultation bilaterally Breasts: normal appearance, no masses or tenderness Heart: regular rate and rhythm Abdomen: soft, non-tender; bowel sounds normal; no masses,  no organomegaly Extremities: extremities normal, atraumatic, no cyanosis or edema Skin: Skin color, texture, turgor normal. No rashes or lesions Lymph nodes: Cervical, supraclavicular, and axillary nodes normal. No abnormal inguinal nodes palpated Neurologic: Grossly normal   Pelvic: External genitalia:  left inferior inclusion cyst at inferior lower left labia, stable              Urethra:  normal appearing urethra with no masses, tenderness or lesions              Bartholins and Skenes: normal  Vagina: normal appearing vagina with normal color and no discharge, no lesions              Cervix: no lesions              Pap taken: No. Bimanual Exam:  Uterus:  normal size, contour, position, consistency, mobility, non-tender              Adnexa: normal adnexa and no mass, fullness, tenderness               Rectovaginal: Confirms               Anus:  normal sphincter tone, no lesions  Chaperone, Ina Homes, CMA, was present for exam.  Assessment/Plan: 1. Well woman exam with routine gynecological exam - pap and HR HPV negative 2021.  Not indicated today. - MMG 04/2020.  Order placed to do today - colonoscopy 10/2018, follow up 7 years - lab work done last year - care gaps reviewed/udpated  2. Irregular bleeding - Follicle stimulating  hormone - Estradiol  3. H/o ASCUS pap with neg HR HPV 2014, 2017  4.  Vulvar inclusion cyst, stable

## 2021-05-01 LAB — FOLLICLE STIMULATING HORMONE: FSH: 10 m[IU]/mL

## 2021-05-01 LAB — ESTRADIOL: Estradiol: 52.4 pg/mL

## 2021-05-08 ENCOUNTER — Ambulatory Visit (HOSPITAL_BASED_OUTPATIENT_CLINIC_OR_DEPARTMENT_OTHER): Payer: Commercial Managed Care - PPO | Admitting: Radiology

## 2021-05-14 ENCOUNTER — Ambulatory Visit (HOSPITAL_BASED_OUTPATIENT_CLINIC_OR_DEPARTMENT_OTHER): Payer: Commercial Managed Care - PPO | Admitting: Radiology

## 2021-05-21 ENCOUNTER — Other Ambulatory Visit: Payer: Self-pay | Admitting: Obstetrics & Gynecology

## 2021-05-21 DIAGNOSIS — Z1231 Encounter for screening mammogram for malignant neoplasm of breast: Secondary | ICD-10-CM

## 2021-05-23 DIAGNOSIS — Z1231 Encounter for screening mammogram for malignant neoplasm of breast: Secondary | ICD-10-CM

## 2021-09-05 ENCOUNTER — Other Ambulatory Visit (INDEPENDENT_AMBULATORY_CARE_PROVIDER_SITE_OTHER): Payer: Self-pay

## 2022-05-08 ENCOUNTER — Ambulatory Visit (HOSPITAL_BASED_OUTPATIENT_CLINIC_OR_DEPARTMENT_OTHER): Payer: Commercial Managed Care - PPO | Admitting: Obstetrics & Gynecology

## 2022-05-15 ENCOUNTER — Encounter (HOSPITAL_BASED_OUTPATIENT_CLINIC_OR_DEPARTMENT_OTHER): Payer: Self-pay | Admitting: Obstetrics & Gynecology

## 2023-07-11 ENCOUNTER — Ambulatory Visit (HOSPITAL_BASED_OUTPATIENT_CLINIC_OR_DEPARTMENT_OTHER)
Admission: RE | Admit: 2023-07-11 | Discharge: 2023-07-11 | Disposition: A | Discharge status: Home / Self Care | Payer: 59 | Source: Ambulatory Visit | Attending: Certified Nurse Midwife | Admitting: Certified Nurse Midwife

## 2023-07-11 ENCOUNTER — Ambulatory Visit (INDEPENDENT_AMBULATORY_CARE_PROVIDER_SITE_OTHER): Payer: 59 | Admitting: Certified Nurse Midwife

## 2023-07-11 ENCOUNTER — Encounter (HOSPITAL_BASED_OUTPATIENT_CLINIC_OR_DEPARTMENT_OTHER): Payer: Self-pay | Admitting: Radiology

## 2023-07-11 ENCOUNTER — Encounter (HOSPITAL_BASED_OUTPATIENT_CLINIC_OR_DEPARTMENT_OTHER): Payer: Self-pay | Admitting: Certified Nurse Midwife

## 2023-07-11 ENCOUNTER — Other Ambulatory Visit (HOSPITAL_COMMUNITY)
Admission: RE | Admit: 2023-07-11 | Discharge: 2023-07-11 | Disposition: A | Discharge status: Home / Self Care | Payer: 59 | Source: Ambulatory Visit | Attending: Obstetrics & Gynecology | Admitting: Obstetrics & Gynecology

## 2023-07-11 VITALS — BP 120/75 | HR 66 | Ht 63.25 in | Wt 130.0 lb

## 2023-07-11 DIAGNOSIS — Z124 Encounter for screening for malignant neoplasm of cervix: Secondary | ICD-10-CM | POA: Insufficient documentation

## 2023-07-11 DIAGNOSIS — Z30431 Encounter for routine checking of intrauterine contraceptive device: Secondary | ICD-10-CM

## 2023-07-11 DIAGNOSIS — Z0000 Encounter for general adult medical examination without abnormal findings: Secondary | ICD-10-CM

## 2023-07-11 DIAGNOSIS — Z113 Encounter for screening for infections with a predominantly sexual mode of transmission: Secondary | ICD-10-CM | POA: Diagnosis not present

## 2023-07-11 DIAGNOSIS — Z1231 Encounter for screening mammogram for malignant neoplasm of breast: Secondary | ICD-10-CM | POA: Diagnosis not present

## 2023-07-11 DIAGNOSIS — Z01419 Encounter for gynecological examination (general) (routine) without abnormal findings: Secondary | ICD-10-CM | POA: Diagnosis not present

## 2023-07-11 NOTE — Progress Notes (Signed)
 Ashley Mcclain

## 2023-07-11 NOTE — Progress Notes (Signed)
58 y.o. [id number] Divorced White or Caucasian female here for annual exam. She works full time as an Airline pilot in an office. She has 2 sons (26 and 73) and just received notice that her youngest is expecting a baby in August 2025. This will be her 1st grandchild and she is very happy. She has been divorced for 12 years and was only recently sexually active with a new partner after 12 years. No issues w/ becoming sexually active again. She has Mirena IUD inserted 10/2018. Mirena was inserted due to menorrhagia/dysmenorrhea and has been effective.  Has only experienced one episode of spotting this year. Currently satisfied with Mirena IUD.  No LMP recorded. (Menstrual status: IUD).          Sexually active: Yes.    The current method of family planning is vasectomy and IUD.     The pregnancy intention screening data noted above was reviewed. Potential methods of contraception were discussed. The patient elected to proceed with No data recorded.  Exercising: Yes.     Smoker:  no  Health Maintenance: Pap:  Last pap 2021 Negative History of abnormal Pap:  2 prior ASCUS pap with Negative HR HPV MMG:  04/21/20 neg, none since Colonoscopy:  10/2018, pt states next Colonoscopy due 2027 BMD:   n/a Screening Labs: Pt plans to establish care with new primary care provider. Agreed to routine screening labs today. Hx Vitamin B deficiency.    reports that she has never smoked. She has never used smokeless tobacco. She reports that she does not drink alcohol and does not use drugs.  Past Medical History:  Diagnosis Date   Anemia    Mild and was told she had low B12 in the past   Ear ache    Environmental exposure    To grass   Influenza 06/2017   Numbness in left leg 03/26/2013   Raynaud's phenomenon    [facility] spotted fever 01/2017   due to tick bite     Past Surgical History:  Procedure Laterality Date   bladder dilation  about 7 years ago   COLONOSCOPY W/ POLYPECTOMY  2014   x3    TONSILLECTOMY AND ADENOIDECTOMY     TOTAL HIP ARTHROPLASTY Right 10/02/2016   Procedure: RIGHT TOTAL HIP ARTHROPLASTY ANTERIOR APPROACH;  Surgeon: Ollen Gross, MD;  Location: [facility];  Service: Orthopedics;  Laterality: Right;    Current Outpatient Medications  Medication Sig Dispense Refill   levonorgestrel (MIRENA) 20 MCG/24HR IUD 1 each by Intrauterine route once.     Probiotic Product (ALIGN) 4 MG CAPS Take by mouth. (Patient not taking: Reported on 07/11/2023)     No current facility-administered medications for this visit.    Family History  Problem Relation Age of Onset   Fibromyalgia Mother    Arthritis Father    Hypertension Father    Hypertension Brother    Diabetes Maternal Grandfather    Stroke Maternal Uncle     ROS: Constitutional: negative Genitourinary:negative  Exam:   BP 120/75 (BP Location: Right Arm, Patient Position: Sitting)   Pulse 66   Ht 5' 3.25" (1.607 m)   Wt 130 lb (59 kg)   BMI 22.85 kg/m   Height: 5' 3.25" (160.7 cm)  General appearance: alert, cooperative and appears stated age Head: Normocephalic, without obvious abnormality, atraumatic Lungs: clear to auscultation bilaterally Breasts: normal appearance, no masses or tenderness, Inspection negative, No nipple retraction or dimpling, No nipple discharge or bleeding, No axillary or supraclavicular adenopathy,  Normal to palpation without dominant masses Heart: regular rate and rhythm Abdomen: soft, non-tender; bowel sounds normal; no masses,  no organomegaly Extremities: extremities normal, atraumatic, no cyanosis or edema Skin: Skin color, texture, turgor normal. No rashes or lesions Lymph nodes: Cervical, supraclavicular, and axillary nodes normal. No abnormal inguinal nodes palpated Neurologic: Grossly normal   Pelvic: External genitalia:  no lesions              Urethra:  normal appearing urethra with no masses, tenderness or lesions              Bartholins and Skenes: normal                  Vagina: normal appearing vagina with normal color and no discharge, no lesions              Cervix: multiparous appearance, no bleeding following Pap, no cervical motion tenderness, and no lesions IUD strings visible and palpable 2cm              Pap taken: Yes.   Bimanual Exam:  Uterus:  normal and normal size, contour, position, consistency, mobility, non-tender              Adnexa: no mass, fullness, tenderness               Anus:  normal sphincter tone, no lesions  Chaperone, Hendricks Milo, CMA, was present for exam.  Assessment/Plan:   1. Encounter for annual routine gynecological examination - Pt aware of recommendation for annual screening mammograms and self breast awareness - Agrees to screening mammogram today at [address] - Pt encouraged to follow-up with Dermatology for routine skin checks to screen for skin cancer  2. Encounter for screening mammogram for malignant neoplasm of breast (Primary) - MM 3D SCREENING MAMMOGRAM BILATERAL BREAST; Future  3. Cervical cancer screening - Cytology - PAP( [place])  4. Annual physical exam - Pt will establish care with Primary Care Provider - HIV antibody (with reflex) - Hepatitis B Surface AntiGEN - Hepatitis C Antibody - RPR - CBC - Comp Met (CMET) - B12 - VITAMIN D 25 Hydroxy (Vit-D Deficiency, Fractures) - Vitamin B1 - TSH - Hemoglobin A1c - FSH - Lipid panel - Ambulatory referral to [facility]  5. Screen for STD (sexually transmitted disease) - HIV antibody (with reflex) - Hepatitis B Surface AntiGEN - Hepatitis C Antibody - RPR  6. Contraceptive, surveillance, intrauterine device - Discussed options for management of Mirena. Plan is to leave Mirena in for 2 more years as long as patient not experiencing menorrhagia or dysmenorrhea. She will call if she desires removal of Mirena IUD and insertion of new Mirena IUD. - FSH pending  RTO 1 year for annual gyn exam and prn if issues  arise. Letta Kocher

## 2023-07-17 LAB — FOLLICLE STIMULATING HORMONE: FSH: 31.1 m[IU]/mL

## 2023-07-17 LAB — RPR: RPR Ser Ql: NONREACTIVE

## 2023-07-17 LAB — COMPREHENSIVE METABOLIC PANEL
ALT: 12 [IU]/L (ref 0–32)
AST: 14 [IU]/L (ref 0–40)
Albumin: 4.5 g/dL (ref 3.8–4.9)
Alkaline Phosphatase: 55 [IU]/L (ref 44–121)
BUN/Creatinine Ratio: 19 (ref 9–23)
BUN: 17 mg/dL (ref 6–24)
Bilirubin Total: 0.4 mg/dL (ref 0.0–1.2)
CO2: 23 mmol/L (ref 20–29)
Calcium: 8.9 mg/dL (ref 8.7–10.2)
Chloride: 102 mmol/L (ref 96–106)
Creatinine, Ser: 0.88 mg/dL (ref 0.57–1.00)
Globulin, Total: 2.3 g/dL (ref 1.5–4.5)
Glucose: 88 mg/dL (ref 70–99)
Potassium: 4.2 mmol/L (ref 3.5–5.2)
Sodium: 140 mmol/L (ref 134–144)
Total Protein: 6.8 g/dL (ref 6.0–8.5)
eGFR: 77 mL/min/{1.73_m2} (ref >59)

## 2023-07-17 LAB — LIPID PANEL
Chol/HDL Ratio: 2.4 {ratio} (ref 0.0–4.4)
Cholesterol, Total: 166 mg/dL (ref 100–199)
HDL: 68 mg/dL (ref >39)
LDL Chol Calc (NIH): 87 mg/dL (ref 0–99)
Triglycerides: 55 mg/dL (ref 0–149)
VLDL Cholesterol Cal: 11 mg/dL (ref 5–40)

## 2023-07-17 LAB — CYTOLOGY - PAP
Chlamydia: NEGATIVE
Comment: NEGATIVE
Comment: NEGATIVE
Comment: NEGATIVE
Comment: NORMAL
High risk HPV: NEGATIVE
Neisseria Gonorrhea: NEGATIVE
Trichomonas: NEGATIVE

## 2023-07-17 LAB — CBC
Hematocrit: 41.3 % (ref 34.0–46.6)
Hemoglobin: 13.6 g/dL (ref 11.1–15.9)
MCH: 30.3 pg (ref 26.6–33.0)
MCHC: 32.9 g/dL (ref 31.5–35.7)
MCV: 92 fL (ref 79–97)
Platelets: 278 10*3/uL (ref 150–450)
RBC: 4.49 x10E6/uL (ref 3.77–5.28)
RDW: 11.7 % (ref 11.7–15.4)
WBC: 5.9 10*3/uL (ref 3.4–10.8)

## 2023-07-17 LAB — HEPATITIS B SURFACE ANTIGEN: Hepatitis B Surface Ag: NEGATIVE

## 2023-07-17 LAB — HEPATITIS C ANTIBODY: Hep C Virus Ab: NONREACTIVE

## 2023-07-17 LAB — HEMOGLOBIN A1C
Est. average glucose Bld gHb Est-mCnc: 120 mg/dL
Hgb A1c MFr Bld: 5.8 % — ABNORMAL HIGH (ref 4.8–5.6)

## 2023-07-17 LAB — VITAMIN B12: Vitamin B-12: 387 pg/mL (ref 232–1245)

## 2023-07-17 LAB — HIV ANTIBODY (ROUTINE TESTING W REFLEX): HIV Screen 4th Generation wRfx: NONREACTIVE

## 2023-07-17 LAB — VITAMIN D 25 HYDROXY (VIT D DEFICIENCY, FRACTURES): Vit D, 25-Hydroxy: 33.9 ng/mL (ref 30.0–100.0)

## 2023-07-17 LAB — TSH: TSH: 2.88 u[IU]/mL (ref 0.450–4.500)

## 2023-07-17 LAB — VITAMIN B1: Thiamine: 96.6 nmol/L (ref 66.5–200.0)

## 2023-07-21 ENCOUNTER — Encounter (HOSPITAL_BASED_OUTPATIENT_CLINIC_OR_DEPARTMENT_OTHER): Payer: Self-pay | Admitting: Certified Nurse Midwife

## 2023-07-30 ENCOUNTER — Ambulatory Visit (INDEPENDENT_AMBULATORY_CARE_PROVIDER_SITE_OTHER): Payer: 59 | Admitting: Certified Nurse Midwife

## 2023-07-30 ENCOUNTER — Other Ambulatory Visit (HOSPITAL_COMMUNITY)
Admission: RE | Admit: 2023-07-30 | Discharge: 2023-07-30 | Disposition: A | Discharge status: Home / Self Care | Payer: 59 | Source: Ambulatory Visit | Attending: Certified Nurse Midwife | Admitting: Certified Nurse Midwife

## 2023-07-30 VITALS — BP 115/67 | HR 71 | Ht 63.25 in | Wt 129.0 lb

## 2023-07-30 DIAGNOSIS — R87612 Low grade squamous intraepithelial lesion on cytologic smear of cervix (LGSIL): Secondary | ICD-10-CM | POA: Insufficient documentation

## 2023-07-30 NOTE — Progress Notes (Signed)
 Patient ID: Ashley Mcclain, female   DOB: July 30, 1965, 58 y.o.   MRN: [id number]  No chief complaint on file.   HPI Ashley Mcclain is a 58 y.o. female.  Pt divorced. New sexual partner (only 1) starting 6 months ago. She does not smoke. Recent pap 07/11/23 LGSIL with High Risk HPV Negative. Prior pap 03/2020 NILM with High Risk HPV Negative. No prior Hx Colposcopy.    Past Medical History:  Diagnosis Date  . Anemia    Mild and was told she had low B12 in the past  . Ear ache   . Environmental exposure    To grass  . Influenza 06/2017  . Numbness in left leg 03/26/2013  . Raynaud's phenomenon   . [place] fever 01/2017   due to tick bite     Past Surgical History:  Procedure Laterality Date  . bladder dilation  about 7 years ago  . COLONOSCOPY W/ POLYPECTOMY  2014   x3  . TONSILLECTOMY AND ADENOIDECTOMY    . TOTAL HIP ARTHROPLASTY Right 10/02/2016   Procedure: RIGHT TOTAL HIP ARTHROPLASTY ANTERIOR APPROACH;  Surgeon: Ollen Gross, MD;  Location: [facility];  Service: Orthopedics;  Laterality: Right;    Family History  Problem Relation Age of Onset  . Fibromyalgia Mother   . Arthritis Father   . Hypertension Father   . Hypertension Brother   . Diabetes Maternal Grandfather   . Stroke Maternal Uncle     Social History Social History   Tobacco Use  . Smoking status: Never  . Smokeless tobacco: Never  Vaping Use  . Vaping status: Never Used  Substance Use Topics  . Alcohol use: No    Alcohol/week: 0.0 standard drinks of alcohol  . Drug use: No    No Known Allergies  Current Outpatient Medications  Medication Sig Dispense Refill  . levonorgestrel (MIRENA) 20 MCG/24HR IUD 1 each by Intrauterine route once.    . Probiotic Product (ALIGN) 4 MG CAPS Take by mouth. (Patient not taking: Reported on 07/11/2023)     No current facility-administered medications for this visit.    Review of Systems Review of Systems  Constitutional: Negative.   All other  systems reviewed and are negative.  There were no vitals taken for this visit.  Physical Exam Physical Exam Genitourinary:     Data Reviewed Pap Smear  Assessment    Procedure Details  The risks and benefits of the procedure and Written informed consent obtained.  Speculum placed in vagina and excellent visualization of cervix achieved, cervix swabbed x 3 with acetic acid solution. SCJ visualized. Small nabothian at 6:00. No acetowhite lesions noted. No punctation or HPV changes noted.   Specimens: ECC  Complications: none.     Plan    Specimens labelled and sent to Pathology. Repeat pap and repeat High Risk HPV in 1 year. Pt aware of elevated HgA1C and plans to establish care with Primary  Care Provider.     Toma Aran Adden Strout 07/30/2023, 11:17 AM

## 2023-08-01 LAB — SURGICAL PATHOLOGY

## 2023-08-08 ENCOUNTER — Encounter (HOSPITAL_BASED_OUTPATIENT_CLINIC_OR_DEPARTMENT_OTHER): Payer: Self-pay | Admitting: Certified Nurse Midwife

## 2023-08-15 ENCOUNTER — Encounter (HOSPITAL_BASED_OUTPATIENT_CLINIC_OR_DEPARTMENT_OTHER): Payer: 59 | Admitting: Certified Nurse Midwife

## 2023-08-19 ENCOUNTER — Encounter (HOSPITAL_BASED_OUTPATIENT_CLINIC_OR_DEPARTMENT_OTHER): Payer: Self-pay | Admitting: Family Medicine

## 2023-08-19 ENCOUNTER — Ambulatory Visit (INDEPENDENT_AMBULATORY_CARE_PROVIDER_SITE_OTHER): Payer: 59 | Admitting: Family Medicine

## 2023-08-19 VITALS — BP 100/69 | HR 79 | Ht 64.0 in | Wt 130.1 lb

## 2023-08-19 DIAGNOSIS — Z1211 Encounter for screening for malignant neoplasm of colon: Secondary | ICD-10-CM | POA: Insufficient documentation

## 2023-08-19 DIAGNOSIS — K580 Irritable bowel syndrome with diarrhea: Secondary | ICD-10-CM | POA: Insufficient documentation

## 2023-08-19 DIAGNOSIS — R7303 Prediabetes: Secondary | ICD-10-CM | POA: Insufficient documentation

## 2023-08-19 DIAGNOSIS — R252 Cramp and spasm: Secondary | ICD-10-CM | POA: Insufficient documentation

## 2023-08-19 DIAGNOSIS — M791 Myalgia, unspecified site: Secondary | ICD-10-CM | POA: Insufficient documentation

## 2023-08-19 DIAGNOSIS — R151 Fecal smearing: Secondary | ICD-10-CM | POA: Insufficient documentation

## 2023-08-19 DIAGNOSIS — R140 Abdominal distension (gaseous): Secondary | ICD-10-CM | POA: Insufficient documentation

## 2023-08-19 DIAGNOSIS — K219 Gastro-esophageal reflux disease without esophagitis: Secondary | ICD-10-CM | POA: Insufficient documentation

## 2023-08-19 DIAGNOSIS — R194 Change in bowel habit: Secondary | ICD-10-CM | POA: Insufficient documentation

## 2023-08-19 DIAGNOSIS — K625 Hemorrhage of anus and rectum: Secondary | ICD-10-CM | POA: Insufficient documentation

## 2023-08-19 DIAGNOSIS — Z860100 Personal history of colon polyps, unspecified: Secondary | ICD-10-CM | POA: Insufficient documentation

## 2023-08-19 DIAGNOSIS — R141 Gas pain: Secondary | ICD-10-CM | POA: Insufficient documentation

## 2023-08-19 NOTE — Assessment & Plan Note (Signed)
 Labs generally reassuring.  We discussed possible causes for cramping.  Discussed recommendations related to conservative measures, would focus on regular stretching of bilateral lower extremities, with particular focus on tabs.  Recommend stretching at least 3-4 times a day, particularly before bed.  If continuing to have issues despite this, could consider incorporating B complex vitamin

## 2023-08-19 NOTE — Progress Notes (Signed)
 New Patient Office Visit  Subjective   Patient ID: Ashley Mcclain, female    DOB: [birth year]  Age: 58 y.o. MRN: [id number]  CC:  Chief Complaint  Patient presents with   New Patient (Initial Visit)    New Patient just establishing care would like for you to look over labs from donna also has been getting cramps in legs and feet     HPI Ashley Mcclain presents to establish care Last PCP - with [facility]  Patient does see OB/GYN here at [facility].  She did have labs completed somewhat recently.  Has questions about these labs.  Prediabetes: Noted on recent labs with hemoglobin A1c of 5.8%.  It does appear that prior A1c about 10 years ago in chart was within prediabetes range at 5.7%.  Denies any concerns related to polyuria or polydipsia.  Patient generally does avoid sugar in her diet, but will have various sources of carbohydrates.  Cramping: Has noted some issues with cramping, primarily in the lower legs.  She notes increased symptoms when she is not as active.  Cramping can occur during the day as well as at night.  She occasionally has had some tingling as well.  As above, she did have labs recently with her OB/GYN as part of evaluation.  Has some pain over upper back primarily.  Notes that for decades she has had some pain in upper back, primarily over the left side.  She thinks that it is partly to posterior/work environment.  She has tried some conservative measures with temporary relief.  Patient is originally from [place]. Patient works in Audiological scientist. She enjoys dancing, hiking, being outdoors.  Outpatient Encounter Medications as of 08/19/2023  Medication Sig   levonorgestrel (MIRENA) 20 MCG/24HR IUD 1 each by Intrauterine route once.   Probiotic Product (ALIGN) 4 MG CAPS Take by mouth. (Patient not taking: Reported on 08/19/2023)   No facility-administered encounter medications on file as of 08/19/2023.    Past Medical History:  Diagnosis Date   Anemia    Mild  and was told she had low B12 in the past   Ear ache    Environmental exposure    To grass   Influenza 06/2017   Numbness in left leg 03/26/2013   Raynaud's phenomenon    [facility] spotted fever 01/2017   due to tick bite     Past Surgical History:  Procedure Laterality Date   bladder dilation  about 7 years ago   COLONOSCOPY W/ POLYPECTOMY  2014   x3   TONSILLECTOMY AND ADENOIDECTOMY     TOTAL HIP ARTHROPLASTY Right 10/02/2016   Procedure: RIGHT TOTAL HIP ARTHROPLASTY ANTERIOR APPROACH;  Surgeon: Ollen Gross, MD;  Location: [facility];  Service: Orthopedics;  Laterality: Right;    Family History  Problem Relation Age of Onset   Fibromyalgia Mother    Arthritis Father    Hypertension Father    Hypertension Brother    Diabetes Maternal Grandfather    Stroke Maternal Uncle     Social History   Socioeconomic History   Marital status: Single    Spouse name: Not on file   Number of children: Not on file   Years of education: Not on file   Highest education level: Not on file  Occupational History   Not on file  Tobacco Use   Smoking status: Never    Passive exposure: Never   Smokeless tobacco: Never  Vaping Use   Vaping status: Never Used  Substance and Sexual Activity   Alcohol use: No    Alcohol/week: 0.0 standard drinks of alcohol   Drug use: No   Sexual activity: Not Currently    Partners: Male    Birth control/protection: I.U.D.    Comment: Mirena inserted 12/03/18  Other Topics Concern   Not on file  Social History Narrative   ** Merged History Encounter **       Social Drivers of Corporate investment banker Strain: Not on file  Food Insecurity: Not on file  Transportation Needs: Not on file  Physical Activity: Not on file  Stress: Not on file  Social Connections: Not on file  Intimate Partner Violence: Not on file    Objective   BP 100/69 (BP Location: Left Arm, Patient Position: Sitting, Cuff Size: Normal)   Pulse 79   Ht 5\' 4"  (1.626 m)    Wt 130 lb 1.6 oz (59 kg)   SpO2 100%   BMI 22.33 kg/m   Physical Exam  58 year old female in no acute distress Cardiovascular exam with regular rate and rhythm Lungs clear to auscultation bilaterally She does have some palpable cords that are tender to palpation within thoracic region, primarily superior to left scapula and to a lesser degree medial to left scapula.  Does not have any notable radiation of pain on palpation.  Assessment & Plan:   Trigger point Assessment & Plan: Suspect possible underlying trigger points and do feel that patient would benefit from physical therapy evaluation and treatment.  Patient is amenable, referral placed today.  Orders: -     Ambulatory referral to Physical Therapy  Prediabetes Assessment & Plan: Noted on prior labs, discussed in office today.  Recommend conservative measures.  A1c has generally been stable over the last decade.  Do not feel that significant changes are required.   Leg cramping Assessment & Plan: Labs generally reassuring.  We discussed possible causes for cramping.  Discussed recommendations related to conservative measures, would focus on regular stretching of bilateral lower extremities, with particular focus on tabs.  Recommend stretching at least 3-4 times a day, particularly before bed.  If continuing to have issues despite this, could consider incorporating B complex vitamin   Return in about 6 months (around 02/19/2024) for CPE.    ___________________________________________ Aven Cegielski de [place], MD, ABFM, CAQSM Primary Care and Sports Medicine [facility]

## 2023-08-19 NOTE — Patient Instructions (Signed)
  Medication Instructions:  Your physician recommends that you continue on your current medications as directed. Please refer to the Current Medication list given to you today. --If you need a refill on any your medications before your next appointment, please call your pharmacy first. If no refills are authorized on file call the office.-- Lab Work: Your physician has recommended that you have lab work today: no If you have labs (blood work) drawn today and your tests are completely normal, you will receive your results via MyChart message OR a phone call from our staff.  Please ensure you check your voicemail in the event that you authorized detailed messages to be left on a delegated number. If you have any lab test that is abnormal or we need to change your treatment, we will call you to review the results.  Referrals/Procedures/Imaging: yes  Follow-Up: Your next appointment:   Your physician recommends that you schedule a follow-up appointment in: 4-6 months with Dr. de [place]  You will receive a text message or e-mail with a link to a survey about your care and experience with [place] today! We would greatly appreciate your feedback!   Thanks for letting us be apart of your health journey!!  Primary Care and Sports Medicine   Dr. Ceasar Mons [place]   We encourage you to activate your patient portal called "MyChart".  Sign up information is provided on this After Visit Summary.  MyChart is used to connect with patients for Virtual Visits (Telemedicine).  Patients are able to view lab/test results, encounter notes, upcoming appointments, etc.  Non-urgent messages can be sent to your provider as well. To learn more about what you can do with MyChart, please visit --  [email or web address].

## 2023-08-19 NOTE — Assessment & Plan Note (Signed)
 Suspect possible underlying trigger points and do feel that patient would benefit from physical therapy evaluation and treatment.  Patient is amenable, referral placed today.

## 2023-08-19 NOTE — Assessment & Plan Note (Signed)
 Noted on prior labs, discussed in office today.  Recommend conservative measures.  A1c has generally been stable over the last decade.  Do not feel that significant changes are required.

## 2023-11-07 ENCOUNTER — Encounter (HOSPITAL_BASED_OUTPATIENT_CLINIC_OR_DEPARTMENT_OTHER): Payer: Self-pay | Admitting: Family Medicine

## 2023-11-07 ENCOUNTER — Encounter (HOSPITAL_BASED_OUTPATIENT_CLINIC_OR_DEPARTMENT_OTHER): Payer: Self-pay | Admitting: Obstetrics & Gynecology

## 2023-11-13 ENCOUNTER — Encounter (HOSPITAL_BASED_OUTPATIENT_CLINIC_OR_DEPARTMENT_OTHER): Payer: Self-pay | Admitting: Certified Nurse Midwife

## 2023-12-23 ENCOUNTER — Other Ambulatory Visit (HOSPITAL_COMMUNITY): Payer: Self-pay | Admitting: Gastroenterology

## 2023-12-23 DIAGNOSIS — R1033 Periumbilical pain: Secondary | ICD-10-CM

## 2023-12-29 ENCOUNTER — Ambulatory Visit (HOSPITAL_COMMUNITY)
Admission: RE | Admit: 2023-12-29 | Discharge: 2023-12-29 | Disposition: A | Discharge status: Home / Self Care | Source: Ambulatory Visit | Attending: Gastroenterology | Admitting: Gastroenterology

## 2023-12-29 DIAGNOSIS — R1033 Periumbilical pain: Secondary | ICD-10-CM | POA: Diagnosis present

## 2023-12-29 MED ORDER — IOHEXOL 300 MG/ML  SOLN
100.0000 mL | Freq: Once | INTRAMUSCULAR | Status: AC | PRN
Start: 2023-12-29 — End: 2023-12-29
  Administered 2023-12-29: 100 mL via INTRAVENOUS

## 2024-02-26 ENCOUNTER — Ambulatory Visit (HOSPITAL_BASED_OUTPATIENT_CLINIC_OR_DEPARTMENT_OTHER): Admitting: Family Medicine

## 2024-07-15 ENCOUNTER — Ambulatory Visit (HOSPITAL_BASED_OUTPATIENT_CLINIC_OR_DEPARTMENT_OTHER): Payer: 59 | Admitting: Obstetrics & Gynecology

## 2024-07-15 ENCOUNTER — Ambulatory Visit (HOSPITAL_BASED_OUTPATIENT_CLINIC_OR_DEPARTMENT_OTHER): Admitting: Obstetrics & Gynecology

## 2024-07-16 ENCOUNTER — Ambulatory Visit (HOSPITAL_BASED_OUTPATIENT_CLINIC_OR_DEPARTMENT_OTHER): Admitting: Obstetrics & Gynecology

## 2024-07-19 ENCOUNTER — Ambulatory Visit (HOSPITAL_BASED_OUTPATIENT_CLINIC_OR_DEPARTMENT_OTHER): Admitting: Obstetrics & Gynecology

## 8387-02-09 DEATH — deceased
